# Patient Record
Sex: Female | Born: 1960
Health system: Southern US, Community
[De-identification: ages and names within clinical notes are randomized; demographics above are authoritative.]

## PROBLEM LIST (undated history)

## (undated) DIAGNOSIS — R002 Palpitations: Secondary | ICD-10-CM

## (undated) DIAGNOSIS — I10 Essential (primary) hypertension: Secondary | ICD-10-CM

## (undated) DIAGNOSIS — E0500 Thyrotoxicosis with diffuse goiter without thyrotoxic crisis or storm: Secondary | ICD-10-CM

## (undated) DIAGNOSIS — R0789 Other chest pain: Secondary | ICD-10-CM

## (undated) HISTORY — DX: Essential (primary) hypertension: I10

## (undated) HISTORY — DX: Other chest pain: R07.89

## (undated) HISTORY — PX: ABDOMINAL HYSTERECTOMY: SHX81

## (undated) HISTORY — DX: Palpitations: R00.2

---

## 1988-06-25 HISTORY — PX: TONSILLECTOMY: SHX5217

## 2001-02-12 ENCOUNTER — Encounter (INDEPENDENT_AMBULATORY_CARE_PROVIDER_SITE_OTHER): Payer: Self-pay | Admitting: Specialist

## 2001-02-12 ENCOUNTER — Other Ambulatory Visit: Admission: RE | Admit: 2001-02-12 | Discharge: 2001-02-12 | Payer: Self-pay | Admitting: Obstetrics and Gynecology

## 2001-09-04 ENCOUNTER — Encounter: Payer: Self-pay | Admitting: Emergency Medicine

## 2001-09-04 ENCOUNTER — Encounter: Admission: RE | Admit: 2001-09-04 | Discharge: 2001-09-04 | Payer: Self-pay | Admitting: Emergency Medicine

## 2002-10-26 ENCOUNTER — Other Ambulatory Visit: Admission: RE | Admit: 2002-10-26 | Discharge: 2002-10-26 | Payer: Self-pay | Admitting: Obstetrics and Gynecology

## 2004-01-27 ENCOUNTER — Encounter: Admission: RE | Admit: 2004-01-27 | Discharge: 2004-01-27 | Payer: Self-pay | Admitting: Emergency Medicine

## 2004-06-05 ENCOUNTER — Encounter: Admission: RE | Admit: 2004-06-05 | Discharge: 2004-06-05 | Payer: Self-pay | Admitting: Emergency Medicine

## 2004-08-08 ENCOUNTER — Ambulatory Visit: Payer: Self-pay | Admitting: Psychiatry

## 2004-08-08 ENCOUNTER — Inpatient Hospital Stay (HOSPITAL_COMMUNITY): Admission: RE | Admit: 2004-08-08 | Discharge: 2004-08-10 | Payer: Self-pay | Admitting: Psychiatry

## 2004-08-08 ENCOUNTER — Emergency Department (HOSPITAL_COMMUNITY): Admission: EM | Admit: 2004-08-08 | Discharge: 2004-08-08 | Payer: Self-pay | Admitting: Emergency Medicine

## 2004-08-16 ENCOUNTER — Other Ambulatory Visit: Admission: RE | Admit: 2004-08-16 | Discharge: 2004-08-16 | Payer: Self-pay | Admitting: Obstetrics and Gynecology

## 2004-09-07 ENCOUNTER — Encounter (INDEPENDENT_AMBULATORY_CARE_PROVIDER_SITE_OTHER): Payer: Self-pay | Admitting: Specialist

## 2004-09-07 ENCOUNTER — Ambulatory Visit (HOSPITAL_COMMUNITY): Admission: RE | Admit: 2004-09-07 | Discharge: 2004-09-07 | Payer: Self-pay | Admitting: Obstetrics and Gynecology

## 2005-10-03 ENCOUNTER — Other Ambulatory Visit: Admission: RE | Admit: 2005-10-03 | Discharge: 2005-10-03 | Payer: Self-pay | Admitting: Obstetrics and Gynecology

## 2006-02-21 ENCOUNTER — Ambulatory Visit (HOSPITAL_COMMUNITY): Admission: RE | Admit: 2006-02-21 | Discharge: 2006-02-22 | Payer: Self-pay | Admitting: Obstetrics and Gynecology

## 2006-02-21 ENCOUNTER — Encounter (INDEPENDENT_AMBULATORY_CARE_PROVIDER_SITE_OTHER): Payer: Self-pay | Admitting: Specialist

## 2009-06-29 ENCOUNTER — Inpatient Hospital Stay (HOSPITAL_COMMUNITY): Admission: AD | Admit: 2009-06-29 | Discharge: 2009-07-01 | Payer: Self-pay | Admitting: Internal Medicine

## 2009-06-29 ENCOUNTER — Ambulatory Visit: Payer: Self-pay | Admitting: Cardiology

## 2009-06-29 ENCOUNTER — Encounter: Payer: Self-pay | Admitting: Emergency Medicine

## 2009-06-29 ENCOUNTER — Ambulatory Visit: Payer: Self-pay | Admitting: Diagnostic Radiology

## 2009-06-30 ENCOUNTER — Encounter: Payer: Self-pay | Admitting: Cardiology

## 2009-07-04 ENCOUNTER — Telehealth: Payer: Self-pay | Admitting: Cardiology

## 2009-07-06 ENCOUNTER — Telehealth (INDEPENDENT_AMBULATORY_CARE_PROVIDER_SITE_OTHER): Payer: Self-pay | Admitting: *Deleted

## 2009-07-07 ENCOUNTER — Ambulatory Visit: Payer: Self-pay | Admitting: Cardiology

## 2009-07-07 ENCOUNTER — Ambulatory Visit: Payer: Self-pay

## 2009-07-07 ENCOUNTER — Encounter (HOSPITAL_COMMUNITY): Admission: RE | Admit: 2009-07-07 | Discharge: 2009-08-29 | Payer: Self-pay | Admitting: Cardiology

## 2009-07-08 ENCOUNTER — Encounter: Payer: Self-pay | Admitting: Cardiology

## 2009-07-12 ENCOUNTER — Ambulatory Visit: Payer: Self-pay | Admitting: Family Medicine

## 2009-07-12 DIAGNOSIS — I10 Essential (primary) hypertension: Secondary | ICD-10-CM | POA: Insufficient documentation

## 2009-07-12 DIAGNOSIS — I252 Old myocardial infarction: Secondary | ICD-10-CM

## 2009-07-12 DIAGNOSIS — E0590 Thyrotoxicosis, unspecified without thyrotoxic crisis or storm: Secondary | ICD-10-CM | POA: Insufficient documentation

## 2009-07-19 ENCOUNTER — Ambulatory Visit: Payer: Self-pay | Admitting: Family Medicine

## 2009-07-19 LAB — CONVERTED CEMR LAB
Cholesterol, target level: 200 mg/dL
LDL Goal: 100 mg/dL

## 2009-07-26 ENCOUNTER — Ambulatory Visit: Payer: Self-pay | Admitting: Family Medicine

## 2009-08-23 ENCOUNTER — Ambulatory Visit: Payer: Self-pay | Admitting: Cardiovascular Disease

## 2009-08-23 ENCOUNTER — Ambulatory Visit: Payer: Self-pay | Admitting: Family Medicine

## 2009-08-23 DIAGNOSIS — R079 Chest pain, unspecified: Secondary | ICD-10-CM

## 2009-08-23 DIAGNOSIS — R002 Palpitations: Secondary | ICD-10-CM

## 2009-08-31 ENCOUNTER — Encounter: Payer: Self-pay | Admitting: Cardiovascular Disease

## 2009-09-02 ENCOUNTER — Telehealth: Payer: Self-pay | Admitting: Cardiovascular Disease

## 2009-09-26 ENCOUNTER — Telehealth (INDEPENDENT_AMBULATORY_CARE_PROVIDER_SITE_OTHER): Payer: Self-pay | Admitting: *Deleted

## 2009-10-03 ENCOUNTER — Telehealth: Payer: Self-pay | Admitting: Cardiovascular Disease

## 2009-11-09 ENCOUNTER — Encounter: Payer: Self-pay | Admitting: Cardiovascular Disease

## 2009-12-15 ENCOUNTER — Telehealth: Payer: Self-pay | Admitting: Cardiovascular Disease

## 2010-01-10 ENCOUNTER — Encounter: Payer: Self-pay | Admitting: Cardiovascular Disease

## 2010-02-14 ENCOUNTER — Ambulatory Visit: Payer: Self-pay | Admitting: Cardiovascular Disease

## 2010-02-21 ENCOUNTER — Encounter: Payer: Self-pay | Admitting: Cardiology

## 2010-07-15 ENCOUNTER — Encounter: Payer: Self-pay | Admitting: Emergency Medicine

## 2010-07-27 NOTE — Assessment & Plan Note (Signed)
Summary: chest pain  add on   Visit Type:  np/6 chest pain Primary Provider:  Dr Levonne Hubert  CC:  chest pain/palps/ upset stomach.  History of Present Illness: 50 yo female with h/o HTN, Graves disease with admission to [facility] 1/11 with hyperthyroidism at which time she had tachycardia and a NSTEMI felt to be type II secondary to the hyperthyroidism which was a new diagnosis. She is seen by Dr. Sharl Ma for her thyroid issues.  Stress myoview 07/07/09 without ischemia. She was seen by Dr. Antoine Poche in the hospital. She has recently found out  that she is losing her job. She was seen by Dr. Tawanna Cooler this am and complained of chest pain and was added on to our schedule today.   She tells me that she has been under a tremendous amount of stress at work lately. She first noticed tightness in her arms and then a heaviness in her chest 4-5 days ago. No associated SOB. She felt hot and sweaty. The left sided chest pain lasted for 90 minutes. She did not take NTG. She went to work the next day and was exhausted without any energy. She had some recurrent chest tightness that night. Yesterday she had another episode of heaviness in her chest. She does feel her heart racing at times. There does not seem to be any irregularity.   Problems Prior to Update: 1)  Chest Pain  (ICD-786.50) 2)  Hyperthyroidism, Nos  (ICD-242.90) 3)  Essential Hypertension  (ICD-401.9) 4)  Myocardial Infarction, Hx of  (ICD-412)  Current Medications (verified): 1)  Aspir-Low 81 Mg Tbec (Aspirin) .... Once Daily 2)  Methimazole 10 Mg Tabs (Methimazole) .... Take 3 Tabs Once Daily 3)  Metoprolol Succinate 50 Mg Xr24h-Tab (Metoprolol Succinate) .... 3 in Am, 2 in Pm 4)  Nitro-Dur 0.4 Mg/hr Pt24 (Nitroglycerin) .Marland Kitchen.. 1 Tab As Needed 5)  Klor-Con M20 20 Meq Cr-Tabs (Potassium Chloride Crys Cr) .... Take One Tab Once Daily 6)  Furosemide 20 Mg Tabs (Furosemide) .... Take 1 Tablet By Mouth Every Morning 7)  Zolpidem Tartrate 10  Mg Tabs (Zolpidem Tartrate) .... Take One Tablet As Needed At Bedtime  Allergies: 1)  ! * Latex  Past History:  Past Medical History: Graves disease-hyperthyroidism migraines seasonal allergies ESSENTIAL HYPERTENSION  NSTEMI 1/11 (felt to be secondary to tachycardia/hyperthyroidism) Anxiety Insomnia    Past Surgical History: Reviewed history from 07/12/2009 and no changes required. Hysterectomy Tonsillectomy  Family History: Reviewed history from 07/12/2009 and no changes required. Father: deceased - TB/cancer Mother: deceased - breast cancer Siblings: 1 brother - deceased - throat cancer               3 sisters - healthy No family history of CAD  Social History: Reviewed history from 07/12/2009 and no changes required. Occupation:[employer] Single, no children.  Never Smoked Alcohol use-no Drug use-no  Review of Systems       The patient complains of fatigue, chest pain, palpitations, and anxiety.  The patient denies malaise, fever, weight gain/loss, vision loss, decreased hearing, hoarseness, shortness of breath, prolonged cough, wheezing, sleep apnea, coughing up blood, abdominal pain, blood in stool, nausea, vomiting, diarrhea, heartburn, incontinence, blood in urine, muscle weakness, joint pain, leg swelling, rash, skin lesions, headache, fainting, dizziness, depression, enlarged lymph nodes, easy bruising or bleeding, and environmental allergies.    Vital Signs:  Patient profile:   50 year old female Menstrual status:  hysterectomy Height:      65 inches Weight:  175 pounds Pulse rate:   58 / minute BP sitting:   126 / 77  (left arm) Cuff size:   large  Vitals Entered By: Oswald Hillock (August 23, 2009 11:10 AM)  Physical Exam  General:  General: Well developed, well nourished, NAD HEENT: OP clear, mucus membranes moist SKIN: warm, dry Neuro: No focal deficits Musculoskeletal: Muscle strength 5/5 all ext Psychiatric: Mood and affect  normal Neck: No JVD, no carotid bruits, +  thyromegaly, no lymphadenopathy. Lungs:Clear bilaterally, no wheezes, rhonci, crackles CV: RRR no murmurs, gallops rubs Abdomen: soft, NT, ND, BS present Extremities: No edema, pulses 2+.    Nuclear Study  Procedure date:  07/07/2009  Findings:      Stress Procedure   The patient received IV Lexiscan 0.4 mg over 15-seconds.  Myoview injected at 30-seconds.  There were no significant changes and abdominal pain with infusion.  Quantitative spect images were obtained after a 45 minute delay.  QPS  Raw Data Images:  Normal; no motion artifact; normal heart/lung ratio. Stress Images:  NI: Uniform and normal uptake of tracer in all myocardial segments. Rest Images:  Normal homogeneous uptake in all areas of the myocardium. Subtraction (SDS):  There is no evidence of scar or ischemia. Transient Ischemic Dilatation:  1.07  (Normal <1.22)  Lung/Heart Ratio:  .41  (Normal <0.45)  Quantitative Gated Spect Images  QGS EDV:  109 ml QGS ESV:  52 ml QGS EF:  53 % QGS cine images:  No discrete wall motion abnormalities noted.   Overall Impression   Exercise Capacity: Lexiscan study.  BP Response: Baseline hypertension.  Clinical Symptoms: Abdominal pain, no chest pain.  ECG Impression: No significant ST segment change suggestive of ischemia. Overall Impression: No evidence for ischemia or infarction.  Slightly decreased LV systolic function.       EKG  Procedure date:  08/23/2009  Findings:      Sinus bradycardia, rate of 59 bpm. No ischemic changes.   Echocardiogram  Procedure date:  06/30/2009  Findings:      - Left ventricle: The cavity size was normal. Wall thickness was     increased in a pattern of mild LVH. Systolic function was normal.     The estimated ejection fraction was in the range of 60% to 65%.   - Aortic valve: Mild regurgitation.   - Mitral valve: Moderate regurgitation.   - Pulmonary arteries: PA peak pressure:  56mm Hg (S).  Impression & Recommendations:  Problem # 1:  CHEST PAIN (ICD-786.50) Her chest pain has mostly atypical features, however, she was recently admitted to [facility] with chest pain and found to have mildly elevated cardiac enzymes. Follow up outpatient stress testing with no ischemia. She has had continued chest pain. This may be related to her hyperthyoidism and anxiety. Given the above with severe chest pain, I think it is reasonable to proceed with a diagnostic left and right heart catheterization to exclude CAD and also to assess her pulmonary artery pressures (elevated on echo). I have discussed performing a heart cath this week but she is unable to accomodate this. I have talked her into the week of March 16th. We will get labs that week and plan the cath on Wednesday March 16th. Risks and benefits were discussed with the pt.   Her updated medication list for this problem includes:    Aspir-low 81 Mg Tbec (Aspirin) ..... Once daily    Metoprolol Succinate 50 Mg Xr24h-tab (Metoprolol succinate) .Marland KitchenMarland KitchenMarland KitchenMarland Kitchen 3  in am, 2 in pm    Nitro-dur 0.4 Mg/hr Pt24 (Nitroglycerin) .Marland Kitchen... 1 tab as needed  Orders: Cardiac Catheterization (Cardiac Cath) Event (Event)  Problem # 2:  PALPITATIONS (ICD-785.1) We will have her wear a 21 day event monitor to exclude any malignant arrhythmias.   Her updated medication list for this problem includes:    Aspir-low 81 Mg Tbec (Aspirin) ..... Once daily    Metoprolol Succinate 50 Mg Xr24h-tab (Metoprolol succinate) .Marland KitchenMarland KitchenMarland KitchenMarland Kitchen 3 in am, 2 in pm    Nitro-dur 0.4 Mg/hr Pt24 (Nitroglycerin) .Marland Kitchen... 1 tab as needed  Patient Instructions: 1)  Your physician recommends that you schedule a follow-up appointment in: 4 weeks 2)  Your physician recommends that you return for lab work on August 31, 2009  --CBC with diff, BMP, PT, PTT  786.5 3)  Your physician recommends that you continue on your current medications as directed. Please refer to the Current Medication  list given to you today. 4)  Your physician has recommended that you wear an event monitor.  Event monitors are medical devices that record the heart's electrical activity. Doctors most often use these monitors to diagnose arrhythmias. Arrhythmias are problems with the speed or rhythm of the heartbeat. The monitor is a small, portable device. You can wear one while you do your normal daily activities. This is usually used to diagnose what is causing palpitations/syncope (passing out). 5)  Your physician has requested that you have a cardiac catheterization.  Cardiac catheterization is used to diagnose and/or treat various heart conditions. Doctors may recommend this procedure for a number of different reasons. The most common reason is to evaluate chest pain. Chest pain can be a symptom of coronary artery disease (CAD), and cardiac catheterization can show whether plaque is narrowing or blocking your heart's arteries. This procedure is also used to evaluate the valves, as well as measure the blood flow and oxygen levels in different parts of your heart.  For further information please visit [email or web address]  Please follow instruction sheet, as given.  Appended Document: chest pain  add on Pt. requesting that we refill Zolpidem. Per Dr. Clifton James we can refill one time for 30 tabs. Message left for pt to call back to determine which pharmacy to send refill.  Appended Document: chest pain  add on Spoke with pt. Zolpidem 10 mg (30 tabs, no refills) called to [facility] on [address]. Pt aware there are no refills on this.  Appended Document: chest pain  add on Event monitor reviewed. NSR. Some bradycardia with rates as low as 48 in the am. No SVT or VT. I recommended a cath at last visit but she cancelled. We need to call her and reschedule this. I can do it whenever she would like or I could see her back in clinic first. cdm

## 2010-07-27 NOTE — Progress Notes (Signed)
  Phone Note Other Incoming   Request: Send information Summary of Call: Request received from [employer] forwarded to [employer].

## 2010-07-27 NOTE — Progress Notes (Signed)
Summary: high b/p  Phone Note From Other Clinic Call back at [facility] Phone [phone]   Caller: [place] [facility] [phone] Request: Talk with Nurse Details for Reason: pt at clinic today with high pressure 172/102 sitting.  Initial call taken by: Lorne Skeens,  July 04, 2009 4:20 PM  Follow-up for Phone Call        pt returned to work today she went to her office clinic for a BP check and it was elevated she doesn't c/o feeling bad, office is concerned about her returning to work w/elevated BP, pt is sch for myoview on 1 /13 and f/u w/Dr Cruzito Standre on 1/31, she is on met. 50mg  daily, will discuss w/Dr Tenny Craw and call back Meredith Staggers, RN  July 04, 2009 4:31 PM   Additional Follow-up for Phone Call Additional follow up Details #1::        Discussed w/Dr Tenny Craw have pt increase metoprolol to 75mg  daily, pt is aware, she states she feels a little lightheaded and doesn't feel that great, Meriam Sprague feels pt shouldn't work until BP is better undercontrol, pt is sch for [employer], pt can stay out of work until then, she also states she was not able to get her potassium filled b/c it was too expensive, will call in pot 2 tabs daily at it will be $8 at [facility], pt will start it asap Meredith Staggers, RN  July 04, 2009 5:04 PM     New/Updated Medications: POTASSIUM CHLORIDE CR 10 MEQ CR-CAPS (POTASSIUM CHLORIDE) Take 2 tablet by mouth daily Prescriptions: POTASSIUM CHLORIDE CR 10 MEQ CR-CAPS (POTASSIUM CHLORIDE) Take 2 tablet by mouth daily  #60 x 6   Entered by:   Meredith Staggers, RN   Authorized by:   Rollene Rotunda, MD, [facility]   Signed by:   Meredith Staggers, RN on 07/04/2009   Method used:   Electronically to        [facility]  [phone]* (retail)       [address]       [place], [place]  [address]       Ph: [phone] or [phone]       Fax: [phone]   RxID:   [id number]

## 2010-07-27 NOTE — Procedures (Signed)
Summary: End of Service Summary Page  End of Service Summary Page   Imported By: Debby Freiberg 10/03/2009 16:44:20  _____________________________________________________________________  External Attachment:    Type:   Image     Comment:   External Document  Appended Document: End of Service Summary Page pt. notified of results.

## 2010-07-27 NOTE — Letter (Signed)
Summary: [facility]   Imported By: Kassie Mends 09/12/2009 09:35:21  _____________________________________________________________________  External Attachment:    Type:   Image     Comment:   External Document

## 2010-07-27 NOTE — Progress Notes (Signed)
Summary: Nuclear Pre-Procedure  Phone Note Outgoing Call   Call placed by: Milana Na, EMT-P,  July 06, 2009 2:51 PM Summary of Call: Left message with information on Myoview Information Sheet (see scanned document for details).      Nuclear Med Background Indications for Stress Test: Evaluation for Ischemia, Post Hospital  Indications Comments: 06/29/09 - 07/01/09 New DX Hyperthyroidism CP/HTN NSTEMI >Troponins  History: Echo, Myocardial Infarction  History Comments: 06/30/09 ECHO EF 60-65% mod. MR MI Type 2 NSTEMI     Nuclear Pre-Procedure Cardiac Risk Factors: Hypertension  Nuclear Med Study Referring MD:  J.Hochrein

## 2010-07-27 NOTE — Cardiovascular Report (Signed)
Summary: Pre Cath Orders  Pre Cath Orders   Imported By: Roderic Ovens 09/05/2009 11:32:16  _____________________________________________________________________  External Attachment:    Type:   Image     Comment:   External Document

## 2010-07-27 NOTE — Assessment & Plan Note (Signed)
Summary: 1 wk rov/mm   Vital Signs:  Patient profile:   50 year old female Menstrual status:  hysterectomy BP sitting:   140 / 90  (left arm)  Vitals Entered By: Kern Reap CMA Duncan Dull) (July 26, 2009 10:16 AM)  Reason for Visit follow up HTN   History of Present Illness: [name] is a 50 year old female, who comes in today for evaluation of hypertension.     We saw her a week ago, with marked elevation of her blood pressure with a systolic in the 180 range.  We increased her beta blocker to 100 mg b.i.d. added 20 mg of Lasix daily.  BP today by me 160/84 left arm sitting position.  Pulse is 70 and regular.  No side effects from new medication regime  Allergies: 1)  ! * Latex  Past History:  Past medical, surgical, family and social histories (including risk factors) reviewed for relevance to current acute and chronic problems.  Past Medical History: Reviewed history from 07/12/2009 and no changes required. Myocardial infarction, hx of graves disease migraines seasonal allergies  Past Surgical History: Reviewed history from 07/12/2009 and no changes required. Hysterectomy Tonsillectomy  Family History: Reviewed history from 07/12/2009 and no changes required. Father: deceased - TB/cancer Mother: deceased - breast cancer Siblings: 1 brother - deceased - throat cancer               3 sisters - healthy  Social History: Reviewed history from 07/12/2009 and no changes required. Occupation:[employer] Single Never Smoked Alcohol use-no Drug use-no  Review of Systems      See HPI  Physical Exam  General:  Well-developed,well-nourished,in no acute distress; alert,appropriate and cooperative throughout examination Heart:  160/84, left arm sitting position   Impression & Recommendations:  Problem # 1:  ESSENTIAL HYPERTENSION (ICD-401.9) Assessment Improved  Her updated medication list for this problem includes:    Metoprolol Succinate 50 Mg Xr24h-tab  (Metoprolol succinate) .Marland Kitchen... 2 by mouth two times a day    Furosemide 20 Mg Tabs (Furosemide) .Marland Kitchen... Take 1 tablet by mouth every morning  Complete Medication List: 1)  Aspir-low 81 Mg Tbec (Aspirin) .... Once daily 2)  Methimazole 10 Mg Tabs (Methimazole) .... Take 3 tabs once daily 3)  Metoprolol Succinate 50 Mg Xr24h-tab (Metoprolol succinate) .... 2 by mouth two times a day 4)  Nitro-dur 0.4 Mg/hr Pt24 (Nitroglycerin) .Marland Kitchen.. 1 tab as needed 5)  Klor-con M20 20 Meq Cr-tabs (Potassium chloride crys cr) .... Take one tab once daily 6)  Zolpidem Tartrate 10 Mg Tabs (Zolpidem tartrate) .... Take one tab at bedtime 7)  Furosemide 20 Mg Tabs (Furosemide) .... Take 1 tablet by mouth every morning  Patient Instructions: 1)  increase the metaprolol to 3 tablets in the morning and two tablets at bedtime continued the Lasix one daily. 2)  Check your blood pressure every morning.  Return in 4 weeks for follow-up.........Marland Kitchen remember to bring all the data and the device

## 2010-07-27 NOTE — Progress Notes (Signed)
Summary: appt  Phone Note Call from Patient   Caller: Patient Summary of Call: Pt. calling regarding letter she received. Letter was regarding holter results.  Pt. given results of holter. Pt would like to see Dr. Clifton James before end of summer but does not want to reschedule cath at this time. Pt given appt with Dr. Clifton James on February 14, 2010. Initial call taken by: Dossie Arbour, RN, BSN,  December 15, 2009 10:15 AM

## 2010-07-27 NOTE — Letter (Signed)
Summary: [facility] Visit Note   [facility] Office Visit Note   Imported By: Roderic Ovens 02/15/2010 11:59:11  _____________________________________________________________________  External Attachment:    Type:   Image     Comment:   External Document

## 2010-07-27 NOTE — Assessment & Plan Note (Signed)
Summary: Cardiology Nuclear Study  Nuclear Med Background Indications for Stress Test: Evaluation for Ischemia, Post Hospital  Indications Comments: 06/29/09 - 07/01/09 New DX Hyperthyroidism CP/HTN NSTEMI >Troponins  History: Echo, Myocardial Infarction  History Comments: 06/30/09 ECHO EF 60-65% mod. MR MI Type 2 NSTEMI     Nuclear Pre-Procedure Cardiac Risk Factors: Hypertension Caffeine/Decaff Intake: None NPO After: 7:30 PM Lungs: clear IV 0.9% NS with Angio Cath: 20g     IV Site: (R) AC IV Started by: Stanton Kidney EMT-P Chest Size (in) 38     Cup Size B     Height (in): 65 Weight (lb): 166 BMI: 27.72 Tech Comments: Metoprolol held > 24 hours, per Pt.  Patient switched to Lexiscan due to HTN.  Nuclear Med Study 1 or 2 day study:  1 day     Stress Test Type:  Eugenie Birks Reading MD:  Marca Ancona, MD     Referring MD:  J.Hochrein Resting Radionuclide:  Technetium 6m Tetrofosmin     Resting Radionuclide Dose:  11.0 mCi  Stress Radionuclide:  Technetium 47m Tetrofosmin     Stress Radionuclide Dose:  33.0 mCi   Stress Protocol   Lexiscan: 0.4 mg   Stress Test Technologist:  Milana Na EMT-P     Nuclear Technologist:  Burna Mortimer Deal RT-N  Rest Procedure  Myocardial perfusion imaging was performed at rest 45 minutes following the intravenous administration of Myoview Technetium 80m Tetrofosmin.  Stress Procedure  The patient received IV Lexiscan 0.4 mg over 15-seconds.  Myoview injected at 30-seconds.  There were no significant changes and abdominal pain with infusion.  Quantitative spect images were obtained after a 45 minute delay.  QPS Raw Data Images:  Normal; no motion artifact; normal heart/lung ratio. Stress Images:  NI: Uniform and normal uptake of tracer in all myocardial segments. Rest Images:  Normal homogeneous uptake in all areas of the myocardium. Subtraction (SDS):  There is no evidence of scar or ischemia. Transient Ischemic Dilatation:  1.07  (Normal  <1.22)  Lung/Heart Ratio:  .41  (Normal <0.45)  Quantitative Gated Spect Images QGS EDV:  109 ml QGS ESV:  52 ml QGS EF:  53 % QGS cine images:  No discrete wall motion abnormalities noted.    Overall Impression  Exercise Capacity: Lexiscan study.  BP Response: Baseline hypertension.  Clinical Symptoms: Abdominal pain, no chest pain.  ECG Impression: No significant ST segment change suggestive of ischemia. Overall Impression: No evidence for ischemia or infarction.  Slightly decreased LV systolic function.   Appended Document: Cardiology Nuclear Study No ischemia or infarct.  Appended Document: Nuclear Study  Called patient and left message on machine for pt to call back for results        Appended Document: Cardiology Nuclear Study pt aware of results and aware of appt on 07/25/2009 with Dr Antoine Poche

## 2010-07-27 NOTE — Letter (Signed)
Summary: Results Follow-up  [facility], Main Office  [address]   [place], [place] [address]   Phone: [phone]  Fax: [phone]     Nov 09, 2009 MRN: [id number]   [name] [address] [place], [place]  [address]   Dear [name],  We have received the results from your recent tests and have been unable to contact you.  Please call our office at the number listed above so that Dr. Clifton James                             or his nurse may review the results with you.    Thank you, Angie Rouge, RN [facility]

## 2010-07-27 NOTE — Progress Notes (Signed)
  Phone Note Call from Patient Call back at Home Phone [phone]   Action Taken: Appt Scheduled Summary of Call: 09/02/09 Call patient re:her labs this am,and she told to cancel them along with her cath. I ask her why, and she said because she was wearing  30 day monitor. D.Miller Initial call taken by: Tera Mater, CNA,  September 02, 2009 9:51 AM  Follow-up for Phone Call        Called pt to discuss cath. Left message to call back. Dossie Arbour, RN, BSN  September 02, 2009 10:12 AM left message to call back Dossie Arbour, RN, BSN  September 05, 2009 9:37 AM Pt. has not returned called. Will cancel cath as per message above. Dr. Clifton James aware. [facility] lab notified Follow-up by: Dossie Arbour, RN, BSN,  September 05, 2009 5:35 PM

## 2010-07-27 NOTE — Assessment & Plan Note (Signed)
Summary: new to est-fup heart attack-ok per dr Jennavie Martinek//ccm   Vital Signs:  Patient profile:   50 year old female Menstrual status:  hysterectomy Height:      65 inches Weight:      168 pounds Temp:     98.8 degrees F oral BP sitting:   162 / 110  (left arm) Cuff size:   regular  Vitals Entered By: Kern Reap CMA Duncan Dull) (July 12, 2009 4:02 PM)  Reason for Visit new to establish - follow up hospital  History of Present Illness: [name] is a 50 year old single female, who comes in today as a new patient.  She was recently hospitalized from January the fifth to January the seventh.  She was admitted with chest pain, found to have Graves' disease.  She was also told that she had enzyme elevation, and a slight heart attack.  Her cardiologist is Dr. Leta Jungling, and her endocrinologist is Dr. Sharl Ma.  Her previous medical doctor was Dr. Lorenz Coaster.  She subsequently went to another physician that she did not like therefore, she is switching to [place].  Her current medications are 81 mg baby aspirin daily, Tapazole 10 mg t.i.d. Metaprel 50 mg q.a.m., potassium 20 mEq daily.  She was also given nitroglycerin sublingual to take p.r.n.  She states her employer [employer] will not let her go back to work until her blood pressure is normal.  I explained to her that a big part of normalizing her blood pressure is to resolve.  The Graves' disease.  Therefore, will give her and temporary note, however, long-term will defer to endocrinology.  Last physical exam blood Spring 2009.  Preventive Screening-Counseling & Management  Alcohol-Tobacco     Smoking Status: never      Drug Use:  no.    Allergies: 1)  ! * Latex  Past History:  Past medical, surgical, family and social histories (including risk factors) reviewed, and no changes noted (except as noted below).  Past Medical History: Myocardial infarction, hx of graves disease migraines seasonal allergies  Past Surgical  History: Hysterectomy Tonsillectomy  Family History: Reviewed history and no changes required. Father: deceased - TB/cancer Mother: deceased - breast cancer Siblings: 1 brother - deceased - throat cancer               3 sisters - healthy  Social History: Reviewed history and no changes required. Occupation:[employer] Single Never Smoked Alcohol use-no Drug use-no Smoking Status:  never Drug Use:  no  Review of Systems      See HPI  Physical Exam  General:  Well-developed,well-nourished,in no acute distress; alert,appropriate and cooperative throughout examination Heart:  180 over hundred   Problems:  Medical Problems Added: 1)  Dx of Hyperthyroidism, Nos  (ICD-242.90) 2)  Dx of Essential Hypertension  (ICD-401.9) 3)  Dx of Myocardial Infarction, Hx of  (ICD-412)  Impression & Recommendations:  Problem # 1:  ESSENTIAL HYPERTENSION (ICD-401.9) Assessment New  Her updated medication list for this problem includes:    Metoprolol Succinate 50 Mg Xr24h-tab (Metoprolol succinate) .Marland Kitchen... Take 1 tablet by mouth two times a day  Orders: Prescription Created Electronically [phone])  Problem # 2:  HYPERTHYROIDISM, NOS (ICD-242.90) Assessment: New  Her updated medication list for this problem includes:    Methimazole 10 Mg Tabs (Methimazole) .Marland Kitchen... Take 3 tabs once daily    Metoprolol Succinate 50 Mg Xr24h-tab (Metoprolol succinate) .Marland Kitchen... Take 1 tablet by mouth two times a day  Orders: Prescription Created Electronically [phone])  Complete Medication List: 1)  Aspir-low 81 Mg Tbec (Aspirin) .... Once daily 2)  Methimazole 10 Mg Tabs (Methimazole) .... Take 3 tabs once daily 3)  Metoprolol Succinate 50 Mg Xr24h-tab (Metoprolol succinate) .... Take 1 tablet by mouth two times a day 4)  Nitro-dur 0.4 Mg/hr Pt24 (Nitroglycerin) .Marland Kitchen.. 1 tab as needed 5)  Klor-con M20 20 Meq Cr-tabs (Potassium chloride crys cr) .... Take one tab once daily 6)  Zolpidem Tartrate 10 Mg  Tabs (Zolpidem tartrate) .... Take one tab at bedtime  Patient Instructions: 1)  increase the metacarpal off to 50 mg b.i.d..  Check your blood pressure morning, noon, and night.  Return in one week for follow-up Prescriptions: METOPROLOL SUCCINATE 50 MG XR24H-TAB (METOPROLOL SUCCINATE) Take 1 tablet by mouth two times a day  #200 x 3   Entered and Authorized by:   Roderick Pee MD   Signed by:   Roderick Pee MD on 07/12/2009   Method used:   Electronically to        [facility]  [phone]* (retail)       [address]       [place], [place]  [address]       Ph: [phone] or [phone]       Fax: [phone]   RxID:   [id number]

## 2010-07-27 NOTE — Letter (Signed)
Summary: Cardiac Catheterization Instructions- [facility]  [facility], Main Office  [address]   [place], [place] [address]   Phone: [phone]  Fax: [phone]     08/23/2009 MRN: [id number]  [name] [address] [place], [place]  [address]  Dear Ms. Fulbright,   You are scheduled for a Cardiac Catheterization on March 16, 2011_ with Dr.McAlhany___ Please arrive to the 1st floor of the Heart and Vascular Center at [facility] at 7:30__ am  on the day of your procedure. Please do not arrive before 6:30 a.m. Call the Heart and Vascular Center at [phone] if you are unable to make your appointmnet. The Code to get into the parking garage under the building is_9000_______. Take the elevators to the 1st floor. You must have someone to drive you home. Someone must be with you for the first 24 hours after you arrive home. Please wear clothes that are easy to get on and off and wear slip-on shoes. Do not eat or drink after midnight except water with your medications that morning. Bring all your medications and current insurance cards with you.  ___ DO NOT take these medications before your procedure: ________________________________________________________________  ___ Make sure you take your aspirin.  __x_ You may take ALL of your medications with water that morning. ________________________________________________________________________________________________________________________________  ___ DO NOT take ANY medications before your procedure.  ___ Pre-med instructions:  ________________________________________________________________________________________________________________________________  The usual length of stay after your procedure is 2 to 3 hours. This can vary.  If you have any questions, please call the office at the number listed above.  Please come to office on August 31, 2009 for blood work. The lab opens at 8:30  and is closed from 1:30 - 2:30. Lab closes for the day at 4:30  Dossie Arbour, RN, BSN

## 2010-07-27 NOTE — Letter (Signed)
Summary: [facility] Visit Note   [facility] Office Visit Note   Imported By: Roderic Ovens 03/31/2010 14:02:51  _____________________________________________________________________  External Attachment:    Type:   Image     Comment:   External Document

## 2010-07-27 NOTE — Progress Notes (Signed)
Summary: Event monitor  Phone Note Outgoing Call   Summary of Call: Called pt regarding results of event monitor and possible rescheduling of cardiac cath (see append of 4/11 to office note). Left message to call back. Initial call taken by: Dossie Arbour, RN, BSN,  October 03, 2009 3:45 PM  Follow-up for Phone Call        left message to call back Dossie Arbour, RN, BSN  October 11, 2009 1:18 PM      Appended Document: Event monitor left message to call back.  Appended Document: Event monitor left message to call back  Appended Document: Event monitor Letter sent

## 2010-07-27 NOTE — Assessment & Plan Note (Signed)
Summary: 1 MONTH [name]/NJR   Vital Signs:  Patient profile:   50 year old female Menstrual status:  hysterectomy Weight:      175 pounds BMI:     29.23 Temp:     98.2 degrees F oral BP sitting:   114 / 80  (left arm) Cuff size:   regular  Vitals Entered By: Raechel Ache, RN (August 23, 2009 8:59 AM) CC: 1 mo [name].   CC:  1 mo [name].Marland Kitchen  History of Present Illness: [name] is a 50 year old female, nonsmoker, who works at [employer]........... and is due to lose her job and because the company is relocating............ however she is staying here in [place]....... who comes in today for blood pressure evaluation.  We increased her beta blocker to 150 mg in the morning and 100 mg in the evening.  BP now 120 to 130 systolic, diastolic 80.  She states last Thursday, when she got home, and evening, after a stressful day.  She had some discomfort in her chest.  She describes it as a heavy feeling and points to the left upper anterior chest wall as a source of her discomfort.  She states that it became sharp was an 8 on a scale of one to 10.  She states it radiated down both arms.  She developed some nausea no diaphoresis or shortness of breath.  The episode lasted 60 minutes.  She did not take a nitroglycerin.  Since, then she's had intermittent episodes like this although there are not as severe.  Allergies: 1)  ! * Latex  Past History:  Past medical, surgical, family and social histories (including risk factors) reviewed, and no changes noted (except as noted below).  Past Medical History: Reviewed history from 07/12/2009 and no changes required. Myocardial infarction, hx of graves disease migraines seasonal allergies  Past Surgical History: Reviewed history from 07/12/2009 and no changes required. Hysterectomy Tonsillectomy  Family History: Reviewed history from 07/12/2009 and no changes required. Father: deceased - TB/cancer Mother: deceased - breast cancer Siblings:  1 brother - deceased - throat cancer               3 sisters - healthy  Social History: Reviewed history from 07/12/2009 and no changes required. Occupation:[employer] Single Never Smoked Alcohol use-no Drug use-no  Review of Systems      See HPI  Physical Exam  General:  Well-developed,well-nourished,in no acute distress; alert,appropriate and cooperative throughout examination Lungs:  Normal respiratory effort, chest expands symmetrically. Lungs are clear to auscultation, no crackles or wheezes. Heart:  Normal rate and regular rhythm. S1 and S2 normal without gallop, murmur, click, rub or other extra sounds.........BP 130/80 by me right arm sitting position   Impression & Recommendations:  Problem # 1:  ESSENTIAL HYPERTENSION (ICD-401.9) Assessment Improved  Her updated medication list for this problem includes:    Metoprolol Succinate 50 Mg Xr24h-tab (Metoprolol succinate) .Marland KitchenMarland KitchenMarland KitchenMarland Kitchen 3 in am, 2 in pm    Furosemide 20 Mg Tabs (Furosemide) .Marland Kitchen... Take 1 tablet by mouth every morning  Problem # 2:  CHEST PAIN (ICD-786.50) Assessment: New  Orders: EKG w/ Interpretation (93000)  Complete Medication List: 1)  Aspir-low 81 Mg Tbec (Aspirin) .... Once daily 2)  Methimazole 10 Mg Tabs (Methimazole) .... Take 3 tabs once daily 3)  Metoprolol Succinate 50 Mg Xr24h-tab (Metoprolol succinate) .... 3 in am, 2 in pm 4)  Nitro-dur 0.4 Mg/hr Pt24 (Nitroglycerin) .Marland Kitchen.. 1 tab as needed 5)  Klor-con M20 20  Meq Cr-tabs (Potassium chloride crys cr) .... Take one tab once daily 6)  Zolpidem Tartrate 10 Mg Tabs (Zolpidem tartrate) .... Take one tab at bedtime 7)  Furosemide 20 Mg Tabs (Furosemide) .... Take 1 tablet by mouth every morning  Patient Instructions: 1)  continue your current medications.  I will set Appointment to see her cardiologist ASAP.............go immediately to the cardiology office

## 2010-07-27 NOTE — Assessment & Plan Note (Signed)
Summary: 1 wk rov/njr   Vital Signs:  Patient profile:   50 year old female Menstrual status:  hysterectomy Weight:      169 pounds Temp:     98.4 degrees F oral BP sitting:   180 / 96  (left arm) Cuff size:   regular  Vitals Entered By: Kern Reap CMA Duncan Dull) (July 19, 2009 12:03 PM)  Reason for Visit follow up bp  History of Present Illness: [name] is a 50 year old female, who comes back today for reevaluation of hypertension.  We had her on. atenolol 50 mg b.i.d. however, her BP is 180/96.  Pulse 80 and regular.  Lipid Management History:      Positive NCEP/ATP III risk factors include hypertension and ASHD (either angina/prior MI/prior CABG).  Negative NCEP/ATP III risk factors include female age less than 74 years old and non-tobacco-user status.    Allergies: 1)  ! * Latex  Past History:  Past medical, surgical, family and social histories (including risk factors) reviewed for relevance to current acute and chronic problems.  Past Medical History: Reviewed history from 07/12/2009 and no changes required. Myocardial infarction, hx of graves disease migraines seasonal allergies  Past Surgical History: Reviewed history from 07/12/2009 and no changes required. Hysterectomy Tonsillectomy  Family History: Reviewed history from 07/12/2009 and no changes required. Father: deceased - TB/cancer Mother: deceased - breast cancer Siblings: 1 brother - deceased - throat cancer               3 sisters - healthy  Social History: Reviewed history from 07/12/2009 and no changes required. Occupation:[employer] Single Never Smoked Alcohol use-no Drug use-no  Review of Systems      See HPI  Physical Exam  General:  Well-developed,well-nourished,in no acute distress; alert,appropriate and cooperative throughout examination Heart:  180/96 right arm sitting position   Impression & Recommendations:  Problem # 1:  ESSENTIAL HYPERTENSION  (ICD-401.9) Assessment Unchanged  Her updated medication list for this problem includes:    Metoprolol Succinate 50 Mg Xr24h-tab (Metoprolol succinate) .Marland Kitchen... 2 by mouth two times a day    Furosemide 20 Mg Tabs (Furosemide) .Marland Kitchen... Take 1 tablet by mouth every morning  Complete Medication List: 1)  Aspir-low 81 Mg Tbec (Aspirin) .... Once daily 2)  Methimazole 10 Mg Tabs (Methimazole) .... Take 3 tabs once daily 3)  Metoprolol Succinate 50 Mg Xr24h-tab (Metoprolol succinate) .... 2 by mouth two times a day 4)  Nitro-dur 0.4 Mg/hr Pt24 (Nitroglycerin) .Marland Kitchen.. 1 tab as needed 5)  Klor-con M20 20 Meq Cr-tabs (Potassium chloride crys cr) .... Take one tab once daily 6)  Zolpidem Tartrate 10 Mg Tabs (Zolpidem tartrate) .... Take one tab at bedtime 7)  Furosemide 20 Mg Tabs (Furosemide) .... Take 1 tablet by mouth every morning  Other Orders: Prescription Created Electronically [phone])  Lipid Assessment/Plan:      Based on NCEP/ATP III, the patient's risk factor category is "history of coronary disease, peripheral vascular disease, cerebrovascular disease, or aortic aneurysm".  The patient's lipid goals are as follows: Total cholesterol goal is 200; LDL cholesterol goal is 100; HDL cholesterol goal is 40; Triglyceride goal is 150.     Patient Instructions: 1)  take a 50-mg of the beta-blocker when you get home now, then two tablets at bedtime tonight, then, starting tomorrow two tabs twice a day.  Also add  Lasix, 20 milligrams now then 20 mg every morning.  Check your blood pressure 3 times a day.  Return in one  week for follow-up with the data and the device too Prescriptions: METOPROLOL SUCCINATE 50 MG XR24H-TAB (METOPROLOL SUCCINATE) 2 by mouth two times a day  #400 x 3   Entered and Authorized by:   Roderick Pee MD   Signed by:   Roderick Pee MD on 07/19/2009   Method used:   Electronically to        [facility]  [phone]* (retail)       [address]       [place], [place]  [address]       Ph: [phone] or [phone]       Fax: [phone]   RxID:   [id number] FUROSEMIDE 20 MG TABS (FUROSEMIDE) Take 1 tablet by mouth every morning  #100 x 3   Entered and Authorized by:   Roderick Pee MD   Signed by:   Roderick Pee MD on 07/19/2009   Method used:   Electronically to        [facility]  [phone]* (retail)       [address]       [place], [place]  [address]       Ph: [phone] or [phone]       Fax: [phone]   RxID:   [id number]

## 2010-07-27 NOTE — Assessment & Plan Note (Signed)
Summary: [name] call/lg   Visit Type:  Angie Primary Provider:  Dr Levonne Hubert  CC:  edema/hands/carpal tunnel....denies cp or sob..Valenzuela weight is up 13 lb since 08/2009.  History of Present Illness: 50 yo female with h/o HTN, Graves disease with admission to [facility] 1/11 with hyperthyroidism at which time [name] had tachycardia and a NSTEMI felt to be type II secondary to the hyperthyroidism which was a new diagnosis. [name] is seen by Dr. Sharl Ma for [name] thyroid issues.  Stress myoview 07/07/09 without ischemia. I saw [name] as an office add on in March 2011. At that time, [name] was having daily left sided chest pain with diaphoresis and fatigue. [name] also complained of palpitations. I had [name] wear an event monitor which showed no arrythmias. Given [name] chest pain and the fact that [name] did have a NSTEMI while in the hospital in January 2011, I arranged a left heart cath to exclude CAD and a right heart cath  to assess her PA pressures (elevated on echo). [name] cancelled [name] cath and is here today for follow up. [name] tells me that [name] is feeling better. [name] denies any chest pain, SOB, palpitations, near syncope or syncope. [name] is no longer employed but did receive a good package from [employer] as they were closing so [name] is living off of this. [name] blood pressure at home has been in the 120-130/70-80.     Current Medications (verified): 1)  Aspir-Low 81 Mg Tbec (Aspirin) .... Once Daily 2)  Methimazole 10 Mg Tabs (Methimazole) .... 1/2 Tab Once Daily 3)  Metoprolol Succinate 50 Mg Xr24h-Tab (Metoprolol Succinate) .Marland Kitchen.. 1 Tab Once Daily 4)  Nitrostat 0.4 Mg Subl (Nitroglycerin) .Marland Kitchen.. 1 Tablet Under Tongue At Onset of Chest Pain; You May Repeat Every 5 Minutes For Up To 3 Doses. 5)  Zolpidem Tartrate 10 Mg Tabs (Zolpidem Tartrate) .... Take One Tablet As Needed At Bedtime  Allergies: 1)  ! * Latex  Past History:  Past Medical History: Reviewed history from 08/23/2009 and no changes  required. Graves disease-hyperthyroidism migraines seasonal allergies ESSENTIAL HYPERTENSION  NSTEMI 1/11 (felt to be secondary to tachycardia/hyperthyroidism) Anxiety Insomnia    Social History: Reviewed history from 08/23/2009 and no changes required. Unemployed, worked at [employer], no children.  Never Smoked Alcohol use-no Drug use-no  Review of Systems  The patient denies fatigue, malaise, fever, weight gain/loss, vision loss, decreased hearing, hoarseness, chest pain, palpitations, shortness of breath, prolonged cough, wheezing, sleep apnea, coughing up blood, abdominal pain, blood in stool, nausea, vomiting, diarrhea, heartburn, incontinence, blood in urine, muscle weakness, joint pain, leg swelling, rash, skin lesions, headache, fainting, dizziness, depression, anxiety, enlarged lymph nodes, easy bruising or bleeding, and environmental allergies.    Vital Signs:  Patient profile:   50 year old female Menstrual status:  hysterectomy Height:      65 inches Weight:      188 pounds BMI:     31.40 Pulse rate:   76 / minute Pulse rhythm:   regular BP sitting:   158 / 90  (left arm) Cuff size:   large  Vitals Entered By: Danielle Rankin, CMA (February 14, 2010 9:24 AM)  Physical Exam  General:  General: Well developed, well nourished, NAD Musculoskeletal: Muscle strength 5/5 all ext Psychiatric: Mood and affect normal Neck: No JVD, no carotid bruits, no lymphadenopathy. Lungs:Clear bilaterally, no wheezes, rhonci, crackles CV: RRR no murmurs, gallops rubs Abdomen: soft, NT, ND, BS present Extremities: No edema,  pulses 2+.    EKG  Procedure date:  02/14/2010  Findings:      NSR, normal EKG. Rate 76 bpm.   Impression & Recommendations:  Problem # 1:  MYOCARDIAL INFARCTION, HX OF (ICD-412) [name] had elevated cardiac enzymes this spring but had a normal stress test and normal LV function on echo. [name] refused cath in March. [name] symptoms of chest pain have  completely resolved. [name] EKG today is normal. We have discussed the possibility of coronary artery disease. [name] risk factors include HTN. I will not reschedule [name] cath as [name] is completely asymptomatic. [name] NSTEMI may have been secondary to [name] Graves disease and tachycardia during admission.   [name] updated medication list for this problem includes:    Aspir-low 81 Mg Tbec (Aspirin) ..... Once daily    Metoprolol Succinate 50 Mg Xr24h-tab (Metoprolol succinate) .Marland Kitchen... 1 tab once daily    Nitrostat 0.4 Mg Subl (Nitroglycerin) .Marland Kitchen... 1 tablet under tongue at onset of chest pain; you may repeat every 5 minutes for up to 3 doses.  Problem # 2:  ESSENTIAL HYPERTENSION (ICD-401.9) Elevated today. [name] tells me that it is well controlled at home. I will not make any changes.  [name] will follow up with Dr. Tawanna Cooler.   The following medications were removed from the medication list:    Furosemide 20 Mg Tabs (Furosemide) .Marland Kitchen... Take 1 tablet by mouth every morning [name] updated medication list for this problem includes:    Aspir-low 81 Mg Tbec (Aspirin) ..... Once daily    Metoprolol Succinate 50 Mg Xr24h-tab (Metoprolol succinate) .Marland Kitchen... 1 tab once daily  Patient Instructions: 1)  Your physician recommends that you schedule a follow-up appointment as needed

## 2010-08-09 ENCOUNTER — Telehealth (INDEPENDENT_AMBULATORY_CARE_PROVIDER_SITE_OTHER): Payer: Self-pay | Admitting: *Deleted

## 2010-08-29 ENCOUNTER — Telehealth (INDEPENDENT_AMBULATORY_CARE_PROVIDER_SITE_OTHER): Payer: Self-pay | Admitting: *Deleted

## 2010-08-31 ENCOUNTER — Encounter: Payer: Self-pay | Admitting: Cardiovascular Disease

## 2010-08-31 ENCOUNTER — Telehealth (INDEPENDENT_AMBULATORY_CARE_PROVIDER_SITE_OTHER): Payer: Self-pay | Admitting: *Deleted

## 2010-08-31 NOTE — Progress Notes (Signed)
  Phone Note Outgoing Call Call back at [facility] Phone [phone]   Call placed by: Judithe Modest CMA,  August 09, 2010 4:58 PM Call placed to: Patient Details for Reason: Electronic refill request Summary of Call: Called pt regarding potassium.  Received electronic refill request for potassium and per pt medication list this medication was removed.  LMVM for the patient to call me back to find out if medication was removed in error or if the patient is still taking.  Judithe Modest CMA  August 09, 2010 4:59 PM  Initial call taken by: Judithe Modest CMA,  August 09, 2010 4:59 PM  Follow-up for Phone Call        Attempted to call again and got no answer.  LMOM Follow-up by: Judithe Modest CMA,  August 11, 2010 4:32 PM  Additional Follow-up for Phone Call Additional follow up Details #1::        Attempted call to patient.  LMOM.  No further refill requests sent.  No follow up at this time until pt contacts [place]. Additional Follow-up by: Judithe Modest CMA,  August 21, 2010 9:00 AM

## 2010-09-05 ENCOUNTER — Telehealth (INDEPENDENT_AMBULATORY_CARE_PROVIDER_SITE_OTHER): Payer: Self-pay | Admitting: *Deleted

## 2010-09-05 NOTE — Progress Notes (Signed)
Summary: called pt re potassium  Phone Note Outgoing Call Call back at [facility] Phone [phone]   Call placed by: Celestia Khat, CMA,  August 29, 2010 1:53 PM Call placed to: Patient  Follow-up for Phone Call        Called pt re Potassium , need to verify if she is currently taking this medication. Left her a message to call me back.  This is the 3rd refill request received re this med. [place], [place]  August 29, 2010 1:56 PM

## 2010-09-05 NOTE — Miscellaneous (Signed)
Summary: potassium chloride  Clinical Lists Changes   Medications: Added new medication of POTASSIUM CHLORIDE CR 10 MEQ CR-TABS (POTASSIUM CHLORIDE) take two capsules by mouth everyday. - Signed Rx of POTASSIUM CHLORIDE CR 10 MEQ CR-TABS (POTASSIUM CHLORIDE) take two capsules by mouth everyday.;  #60 x 6;  Signed;  Entered by: Celestia Khat, CMA;  Authorized by: Verne Carrow, MD;  Method used: Electronically to [facility]  [phone]*, [address], [place], [place], [place]  [address], Ph: [phone] or [phone], Fax: [phone]     Prescriptions: POTASSIUM CHLORIDE CR 10 MEQ CR-TABS (POTASSIUM CHLORIDE) take two capsules by mouth everyday.  #60 x 6   Entered by:   Celestia Khat, CMA   Authorized by:   Verne Carrow, MD   Signed by:   Celestia Khat, CMA on 08/31/2010   Method used:   Electronically to        [facility]  [phone]* (retail)       [address]       [place], [place]  [address]       Ph: [phone] or [phone]       Fax: [phone]   RxID:   [id number]

## 2010-09-05 NOTE — Progress Notes (Signed)
Summary: Phone: Potassium chloride  Phone Note Call from Patient   Caller: Patient Summary of Call: spoke with pt, she stated she is currently taking potassium chloride . added this medication to list and refilled at [facility] pharmacy. [place], [place]  August 31, 2010 2:07 PM

## 2010-09-10 LAB — GLUCOSE, CAPILLARY: Glucose-Capillary: 113 mg/dL — ABNORMAL HIGH (ref 70–99)

## 2010-09-10 LAB — POCT CARDIAC MARKERS: CKMB, poc: 2 ng/mL (ref 1.0–8.0)

## 2010-09-10 LAB — TSH: TSH: 0.007 u[IU]/mL — ABNORMAL LOW (ref 0.350–4.500)

## 2010-09-10 LAB — URINALYSIS, ROUTINE W REFLEX MICROSCOPIC
Bilirubin Urine: NEGATIVE
Ketones, ur: NEGATIVE mg/dL
Nitrite: NEGATIVE
Protein, ur: NEGATIVE mg/dL
Urobilinogen, UA: 2 mg/dL — ABNORMAL HIGH (ref 0.0–1.0)

## 2010-09-10 LAB — POCT TOXICOLOGY PANEL

## 2010-09-10 LAB — BASIC METABOLIC PANEL
CO2: 31 mEq/L (ref 19–32)
CO2: 31 mEq/L (ref 19–32)
Calcium: 10.4 mg/dL (ref 8.4–10.5)
Calcium: 10.6 mg/dL — ABNORMAL HIGH (ref 8.4–10.5)
Calcium: 10.6 mg/dL — ABNORMAL HIGH (ref 8.4–10.5)
Calcium: 9.4 mg/dL (ref 8.4–10.5)
Creatinine, Ser: 0.37 mg/dL — ABNORMAL LOW (ref 0.4–1.2)
Creatinine, Ser: 0.4 mg/dL (ref 0.4–1.2)
GFR calc Af Amer: >60 mL/min (ref >60)
GFR calc Af Amer: >60 mL/min (ref >60)
GFR calc Af Amer: >60 mL/min (ref >60)
GFR calc Af Amer: >60 mL/min (ref >60)
GFR calc non Af Amer: >60 mL/min (ref >60)
GFR calc non Af Amer: >60 mL/min (ref >60)
GFR calc non Af Amer: >60 mL/min (ref >60)
Potassium: 3.1 mEq/L — ABNORMAL LOW (ref 3.5–5.1)
Potassium: 4 mEq/L (ref 3.5–5.1)
Sodium: 139 mEq/L (ref 135–145)
Sodium: 141 mEq/L (ref 135–145)
Sodium: 141 mEq/L (ref 135–145)

## 2010-09-10 LAB — HEPATIC FUNCTION PANEL
Albumin: 3.1 g/dL — ABNORMAL LOW (ref 3.5–5.2)
Bilirubin, Direct: 0.2 mg/dL (ref 0.0–0.3)
Total Bilirubin: 0.7 mg/dL (ref 0.3–1.2)

## 2010-09-10 LAB — ETHANOL: Alcohol, Ethyl (B): <5 mg/dL (ref 0–10)

## 2010-09-10 LAB — CARDIAC PANEL(CRET KIN+CKTOT+MB+TROPI)
CK, MB: 1.6 ng/mL (ref 0.3–4.0)
Relative Index: INVALID (ref 0.0–2.5)
Relative Index: INVALID (ref 0.0–2.5)
Relative Index: INVALID (ref 0.0–2.5)
Total CK: 26 U/L (ref 7–177)
Total CK: 32 U/L (ref 7–177)
Total CK: 33 U/L (ref 7–177)
Troponin I: 0.25 ng/mL — ABNORMAL HIGH (ref 0.00–0.06)
Troponin I: 0.46 ng/mL — ABNORMAL HIGH (ref 0.00–0.06)

## 2010-09-10 LAB — PROTIME-INR
INR: 1.05 (ref 0.00–1.49)
Prothrombin Time: 13.6 seconds (ref 11.6–15.2)

## 2010-09-10 LAB — CBC
MCHC: 34.5 g/dL (ref 30.0–36.0)
RBC: 4.05 MIL/uL (ref 3.87–5.11)
WBC: 8.2 10*3/uL (ref 4.0–10.5)

## 2010-09-10 LAB — DIFFERENTIAL
Basophils Relative: 0 % (ref 0–1)
Monocytes Relative: 9 % (ref 3–12)
Neutro Abs: 5 10*3/uL (ref 1.7–7.7)
Neutrophils Relative %: 60 % (ref 43–77)

## 2010-09-10 LAB — MAGNESIUM: Magnesium: 1.4 mg/dL — ABNORMAL LOW (ref 1.5–2.5)

## 2010-09-10 LAB — LIPID PANEL
LDL Cholesterol: 76 mg/dL (ref 0–99)
Total CHOL/HDL Ratio: 3.5 RATIO
VLDL: 12 mg/dL (ref 0–40)

## 2010-09-10 LAB — POTASSIUM: Potassium: 2.8 mEq/L — ABNORMAL LOW (ref 3.5–5.1)

## 2010-09-10 LAB — POCT B-TYPE NATRIURETIC PEPTIDE (BNP): B Natriuretic Peptide, POC: 460 pg/mL — ABNORMAL HIGH (ref 0–100)

## 2010-09-10 LAB — APTT: aPTT: 33 seconds (ref 24–37)

## 2010-09-10 LAB — T4, FREE: Free T4: 3.67 ng/dL — ABNORMAL HIGH (ref 0.80–1.80)

## 2010-09-12 ENCOUNTER — Encounter: Payer: Self-pay | Admitting: Family Medicine

## 2010-09-12 LAB — CONVERTED CEMR LAB
AST: 15 units/L (ref 0–37)
Alkaline Phosphatase: 117 units/L (ref 39–117)
BUN: 10 mg/dL (ref 6–23)
Calcium: 9.6 mg/dL (ref 8.4–10.5)
Cholesterol: 299 mg/dL — ABNORMAL HIGH (ref 0–200)
Creatinine, Ser: 0.68 mg/dL (ref 0.40–1.20)
Free Thyroxine Index: 1.2 (ref 1.0–3.9)
HDL: 64 mg/dL (ref >39)
TSH: 26.266 microintl units/mL — ABNORMAL HIGH (ref 0.350–4.500)
Total Bilirubin: 0.9 mg/dL (ref 0.3–1.2)
Triglycerides: 63 mg/dL (ref <150)

## 2010-09-12 NOTE — Progress Notes (Signed)
  DDS Request received sent to [facility]  September 05, 2010 9:13 AM

## 2010-11-10 NOTE — H&P (Signed)
NAME:  [name], [name] NO.:  [id number]   MEDICAL RECORD NO.:  [id number]          PATIENT TYPE:  AMB   LOCATION:  SDC                           FACILITY:  [facility]   PHYSICIAN:  Juluis Mire, M.D.   DATE OF BIRTH:  1960-12-29   DATE OF ADMISSION:  DATE OF DISCHARGE:                                HISTORY & PHYSICAL   HISTORY OF PRESENT ILLNESS:  The patient is a 50 year old gravida 1, para 0,  abortus 1 single female who presents for hysteroscopy and NovaSure ablation  of the endometrium.  She is also going to have marsupialization of a  Bartholin cyst.   The patient has had a previous bilateral tubal ligation.  Her cycles are  regular.  She has four to five days of flow, four days being heavy.  She  changes pads every two to three hours.  Associated with this is clotting.  She has no discomfort.  In 2002, she had a laparoscopy that revealed  multiple uterine fibroids but no other abnormalities.  Her hemoglobin was  slightly depressed at 11.4.  During examination, a Bartholin cyst was noted  on the right side.  Otherwise, exam revealed the uterus to be upper limits  of normal size and slightly irregular, consistent with known fibroids.  We  went ahead and did a saline infusion ultrasound.  There were several just  small fibroids.  They were abutting the endometrial stripe but not impinging  upon it.  The saline infusion ultrasound was basically unremarkable, with no  intracavitary masses noted.  Again, there was no overt distortion of the  endometrium.  It was also noted that she had a Bartholin cyst, again, on the  right side.  Because of the menorrhagia, the patient has decided to proceed  with endometrial ablation.  Other alternatives include the use of birth  control pills versus __________ or IUD versus hysterectomy.  The patient  understands the options and wishes to try the ablative technique initially.   ALLERGIES:  NO KNOWN DRUG ALLERGIES.    MEDICATIONS:  1.  Zyrtec.  2.  Darvocet.  3.  Wellbutrin.  4.  Ambien.  5.  Clorazepate  6.  Lexapro.  7.  Imitrex.   PAST MEDICAL HISTORY:  She is under active management for migraine  headaches.  Otherwise, usual childhood diseases without any significant  sequelae.   PAST SURGICAL HISTORY:  1.  Bilateral tubal ligation.  2.  Tonsillectomy.   OBSTETRICAL HISTORY:  She has had one TAB.   FAMILY HISTORY:  Mother with a history of breast cancer.  She has a niece  with ovarian cancer.  Brother had lung cancer.   SOCIAL HISTORY:  No tobacco or alcohol use.   REVIEW OF SYSTEMS:  Noncontributory.   PHYSICAL EXAMINATION:  VITAL SIGNS:  The patient is afebrile with stable  vital signs.  HEENT:  The patient normocephalic.  Pupils are equal, round, and reactive to  light and accommodation.  Extraocular movements are intact.  Sclerae and  conjunctivae clear.  Oropharynx clear.  NECK:  Without thyromegaly.  BREASTS:  No  discrete masses.  LUNGS:  Clear.  CARDIAC:  Regular rate and rhythm without murmurs or gallops.  ABDOMEN:  Benign.  No masses or organomegaly or tenderness.  PELVIC:  Exam reveals a Bartholin cyst.  Otherwise, normal external  genitalia.  Vaginal mucosa clear.  Cervix unremarkable.  Uterus upper limits  of normal size, slightly irregular, consistent with known fibroids.  Adnexa  unremarkable.  EXTREMITIES:  Trace edema.  NEUROLOGIC:  Grossly within normal limits.   IMPRESSION:  1.  Menorrhagia.  2.  Bartholin cyst.   PLAN:  The patient will undergo hysteroscopic evaluation.  If there are no  significant abnormalities in the intrauterine cavity, will proceed with  endometrial ablation.  Will also do marsupialization of the Bartholin cyst.  Success rate for endometrial ablation is approximately 80-90%.  The risks  have been discussed, including vascular injury that could lead to hemorrhage  requiring transfusion or possible hysterectomy; bowel or associated  organ  injury that could require further exploratory surgery; risks of deep venous  thrombosis and pulmonary embolus.  With marsupialization, there is a chance  of recurrence.  There is a chance also of hemorrhage and infection.  The  patient voiced understanding of the indications, risks, and options.      JSM/MEDQ  D:  09/07/2004  T:  09/07/2004  Job:  [id number]

## 2010-11-10 NOTE — H&P (Signed)
NAME:  [name], [name] NO.:  [id number]   MEDICAL RECORD NO.:  [id number]          PATIENT TYPE:  AMB   LOCATION:  SDC                           [facility]ACILITY:  [facility]   PHYSICIAN:  Juluis Mire, M.D.   DATE OF BIRTH:  [birth year]   DATE OF ADMISSION:  02/21/2006  DATE OF DISCHARGE:                                HISTORY & PHYSICAL   HISTORY OF PRESENT ILLNESS:  The patient is a 50 year old gravida 1, para 0,  abortus 1, female who presents for laparoscopic-assisted vaginal  hysterectomy.   In relation to the present admission, the patient is know to have uterine  fibroids.  She was initially seen in our practice in 2002.  She had had a  previous bilateral tubal ligation.  Was experiencing marked increase in  menstrual flow.  She underwent a subsequent saline infusion ultrasound that  was basically unremarkable.  She did have uterine irregularities consistent  with know fibroids on exam, and this was confirmed on ultrasound, although,  none of that seemed to be significant.  She underwent a NovaSure ablation  for the menorrhagia.  Subsequently, she has done well but has been  experiencing increasing pain and discomfort, particularly on the left side.  Follow up ultrasound has confirmed enlarging fibroid on that side measuring  approximately 5 cm.  In view of persistent pain and discomfort, the patient  decided to proceed with laparoscopic-assisted vaginal hysterectomy.  Other  options have been discussed including radiologic embolization, continued  conservative management or myomectomy.   ALLERGIES:  LATEX.   MEDICATIONS:  None.   PAST MEDICAL HISTORY:  1. History of migraine headache, under active management.  2. Usual childhood diseases.   PAST SURGICAL HISTORY:  1. Previous bilateral tubal ligation.  2. Previous tonsillectomy.  3. Previous hysterectomy and NovaSure ablation.  4. Had previous marsupialization of a Bartholin's cyst.   OBSTETRIC HISTORY:  One  TAB.   FAMILY HISTORY:  Mother with history of breast cancer.  She has a niece with  ovarian cancer.  Brother had lung cancer.   SOCIAL HISTORY:  No tobacco, alcohol use.   REVIEW OF SYSTEMS:  Noncontributory.   PHYSICAL EXAMINATION:  VITAL SIGNS:  Afebrile, stable vital signs.  HEENT:  The patient is normocephalic.  Pupils equal, round and reactive to  light and accommodations.  Extraocular muscles intact.  Sclerae and  conjunctivae were clear.  Oropharynx clear.  NECK:  Without thyromegaly.  BREASTS:  No discrete masses.  LUNGS:  Clear.  CARDIOVASCULAR:  Regular rate and rhythm without murmurs or gallops.  ABDOMEN:  Benign.  No masses, organomegaly or tenderness.  PELVIC:  Normal external genitalia.  Vaginal mucosa is clear.  Cervix is  unremarkable.  Uterus enlarged.  There is an anterior wall fibroid measuring  approximately 5 cm.  Adnexa unremarkable.  RECTOVAGINAL:  Clear.  EXTREMITIES:  Trace edema.  NEUROLOGICAL:  Grossly within normal limits.   IMPRESSION:  Enlarging uterine fibroid with associated symptomatology.   PLAN:  The patient will undergo laparoscopic-assisted vaginal hysterectomy.  Again, other alternatives have been discussed.  The risks of surgery  have  been explained including the risk of infection, the risk of hemorrhage that  could require transfusion with the risk of AIDS or hepatitis, risk of injury  to adjacent organs (This could include bladder, bowel or ureters that would  require further exploratory surgery.), the risk of deep vein thrombosis and  pulmonary embolus.  The patient expressed understanding of indications,  risks and other options.      Juluis Mire, M.D.  Electronically Signed     JSM/MEDQ  D:  02/21/2006  T:  02/21/2006  Job:  [id number]

## 2010-11-10 NOTE — Op Note (Signed)
[name], [name] NO.:  [id number]   MEDICAL RECORD NO.:  [id number]          PATIENT TYPE:  OIB   LOCATION:  9312                          [facility]ACILITY:  [facility]   PHYSICIAN:  Juluis Mire, M.D.   DATE OF BIRTH:  [birth year]   DATE OF PROCEDURE:  02/21/2006  DATE OF DISCHARGE:                                 OPERATIVE REPORT   PREOPERATIVE DIAGNOSIS:  Uterine fibroids.   POSTOPERATIVE DIAGNOSIS:  Uterine fibroids.   OPERATIVE PROCEDURE:  Laparoscopic assisted vaginal hysterectomy.   SURGEON:  Juluis Mire, M.D.   ASSISTANT:  Zelphia Cairo, M.D.   ANESTHESIA:  General endotracheal anesthesia.   ESTIMATED BLOOD LOSS:  300-400 mL.   PACKS AND DRAINS:  None.   BLOOD REPLACED:  None.   COMPLICATIONS:  None.   INDICATIONS FOR PROCEDURE:  As noted in the history and physical.   PROCEDURE IN DETAIL:  The patient was taken to the OR and placed in the  supine position.  After satisfactory oral general endotracheal anesthesia  was obtained, the patient was placed in the dorsal lithotomy position using  the Allen stirrups.  The abdomen, perineum, and vagina were prepped out with  Betadine.  The bladder was emptied by in and out catheterization.  A Hulka  tenaculum was put in place and secured.  The patient was then draped in a  sterile field.  A subumbilical incision was made with the knife and extended  down to the fascia.  The fascia was entered sharply and the incision in the  fascia extended laterally.  Using blunt pressure, the muscles were separated  and the peritoneum was entered.  The taut laparoscopic trocar was put in  place and inflated.  The laparoscope was then introduced, there was no  evidence of injury to adjacent organs.  The appendix was visualized and  noted to be normal.  The upper abdomen including liver and tip of the  gallbladder were clear.  The uterus was enlarged with multiple uterine  fibroids.  The right ovary was normal.  The left  ovary was somewhat adherent  to the left pelvic sidewall but well out of the cul-de-sac.  Next, a 5 mm  trocar was put in place in the suprapubic area.  Using the Gyrus bipolar, we  first went to the right side.  We separated the right utero-ovarian pedicle  using cautery incision.  The right tube and mesosalpinx were cauterized and  incised.  The right round ligament was cauterized and incised.  We then went  to the left side.  The left utero-ovarian pedicle was cauterized and  incised.  The left tube and mesosalpinx were cauterized and incised.  The  left round ligament was cauterized and incised.  We then continued the  separation of the broad ligament from the side of the uterus using the Gyrus  and cautery incision.  We took it down and then developed a bladder flap.  We had good hemostasis.  We felt the uterus was fairly free at this point in  time.  The abdomen was deflated of its carbon dioxide,  the laparoscope was  removed.   The patient's legs were repositioned.  The Hulka tenaculum was taken out.  A  weighted speculum was placed in the vaginal vault.  The cervix was grasped  with a Christella Hartigan tenaculum.  The cul-de-sac was entered sharply.  The  uterosacral ligaments were clamped, cut, and suture ligated with 0 Vicryl.  The reflection of the vaginal mucosa anteriorly was incised.  The bladder  was dissected superiorly.  Using the Gyrus cautery system, we first  cauterized and incised the paracervical tissue.  Then, using cautery  incision, the parametrium was serially separated from the sides of the  uterus.  We then began morcellating the uterus.  We first removed the cervix  and lower uterine stump.  We then went to the left side of the uterus and  removed areas of the myometrium, the same on the right.  We were then able  to flip the uterus.  The remaining pedicles were clamped and incised and the  whole specimen was passed off the operative field.  At this point in time,  we  had good hemostasis.  A uterosacral plication stitch was put in place and  secured.  The vaginal mucosa was reapproximated in a vertical fashion with  interrupted figure-of-eight sutures of 0 Vicryl.  A Foley catheter was  placed to straight drain with clear urine and a sponge on a sponge stick was  placed in the vaginal vault.   The legs were repositioned, the laparoscope was re-introduced, the abdomen  was reinsufflated with carbon dioxide.  Some oozing was noted from the cuff  and was controlled with the bipolar.  The tubes and ovaries were  unremarkable.  We thoroughly irrigated the pelvis.  We had excellent  hemostasis.  Urine output remained clear and adequate. No evidence of injury  noted to adjacent organs.  The abdomen was desufflated of carbon dioxide,  all trocars were removed.  The subumbilical fascia was closed with two  figure-of-eight sutures of 0 Vicryl, the skin was closed with interrupted  subcuticular 4-0 Vicryl.  The suprapubic incision was closed with Dermabond.  At this point in time, the sponge on a sponge stick was removed from the  vaginal vault.  The patient was taken out of the dorsal lithotomy position.  Once extubated and alert, she was transferred to the recovery room in good  condition.  Sponge, instrument, and needle counts was reported correct by  the circulating nurse x 2.  Again, Foley catheter remained clear at the time  of closure.      Juluis Mire, M.D.  Electronically Signed     JSM/MEDQ  D:  02/21/2006  T:  02/21/2006  Job:  [id number]

## 2010-11-10 NOTE — Discharge Summary (Signed)
[name], [name] NO.:  [id number]   MEDICAL RECORD NO.:  [id number]          PATIENT TYPE:  OIB   LOCATION:  9312                          [facility]ILITY[facility]   PHYSICIAN:  Juluis Mire, M.D.   DATE OF BIRTH:  15-Apr-1961   DATE OF ADMISSION:  02/21/2006  DATE OF DISCHARGE:  02/22/2006                                 DISCHARGE SUMMARY   ADMITTING DIAGNOSIS:  Uterine fibroid.   DISCHARGE DIAGNOSIS:  Uterine fibroid.   OPERATIVE PROCEDURE:  Laparoscopic assisted vaginal hysterectomy.   For complete history and physical please see dictated note.   COURSE IN THE HOSPITAL:  The patient went the above-noted surgery.  Pathology is still pending. She did well postoperatively.  Postop hemoglobin  11.2, discharged home on her first postop day.  At that time she was  tolerating a regular diet.  She is voiding without difficulty.  Her  abdominal exam was benign.  Bowel sounds were active.  All incisions were  clear; and she was having no active vaginal bleeding.   __________  stay in the hospital.  The patient was discharged home in stable  condition.   DISPOSITION:  Routine instructions.  She was to avoid heavy lifting,  __________ or driving a car.  Discharged home with Tylox as needed for pain;  and to continue routine medications.  She is to watch for signs of  infection, nausea, vomiting, increasing abdominal pain, active vaginal  bleeding, or signs of deep venous thrombosis.  Followup will be in the  office in 1 week.      Juluis Mire, M.D.  Electronically Signed     JSM/MEDQ  D:  02/22/2006  T:  02/22/2006  Job:  [id number]

## 2010-11-10 NOTE — Discharge Summary (Signed)
[name], [name] NO.:  [id number]   MEDICAL RECORD NO.:  [id number]          PATIENT TYPE:  IPS   LOCATION:  0504                          FACILITY:  [facility]   PHYSICIAN:  Geoffery Lyons, M.D.      DATE OF BIRTH:  [birth year]   DATE OF ADMISSION:  08/08/2004  DATE OF DISCHARGE:  08/10/2004                                 DISCHARGE SUMMARY   CHIEF COMPLAINT AND PRESENT ILLNESS:  This was the first admission to [facility] for this 50 year old, single, African-American  female admitted.  History of depression, increased anxiety, history of  migraines which she thought was the cause of the depression, fleeting  suicidal ideas.  Went to the emergency room for migraines that she could not  break with the medication.  Has been isolated in bed, vomiting, lost 30  pounds, sleeping fair.  Did not want to be in the unit, did not believe that  she had to be there.   PAST PSYCHIATRIC HISTORY:  First time at [facility].  Sees  __________.   ALCOHOL AND DRUG HISTORY:  Denies the use or abuse of any substances.   PAST MEDICAL HISTORY:  Migraines.   MEDICATIONS:  1.  Wellbutrin XL 300 mg.  2.  Clorazepate 7.5 three times a day.  3.  Ambien 10 at night for sleep.  4.  Darvocet as needed.  5.  Nadolol 20 mg daily.  6.  Zyrtec 10 mg daily,  7.  Hydrocodone 5/500 one to two q.6h. as needed.   PHYSICAL EXAMINATION:  Performed and failed show any acute findings.   LABORATORY DATA:  CBC showed Hemoglobin 11.0, hematocrit 32.8, blood  chemistry within normal limits.  TSH 1.541.   MENTAL STATUS EXAM:  The patient is an alert, female, cooperative with fair  eye contact.  Basically __________ mood fine, unhappy for being in the unit.  Affect flat, upset, __________.  Denied any suicidal or homicidal ideas.  There were no delusions.  There were no hallucinations.  Cognition well  preserved.  ADMISSION DIAGNOSIS   AXIS I:  1.  Depressive disorder,  unspecified.  2.  Adjustment disorder with depression.   AXIS II:  No diagnosis.   AXIS III:  Migraines.   AXIS IV:  Moderate.   AXIS V:  1.  Global assessment of functioning upon admission:  5.  2.  Highest global assessment of functioning in the last year:  90.   HOSPITAL COURSE:  She was admitted and started in individual and group  psychotherapy.  She was maintained on her medications. She was given some  Ambien for sleep.  She claimed that it was a mistake to come to the unit  __________ with the migraine headaches. The migraines affect her and said  that she might have said that she wanted to die if she was to continue to  have headaches, but she never said that she was going to kill herself.  She  says that she will not hurt herself.  Admitted to being frustrated because  of the headache and wanted to  go home.  She was going to see her therapist  and Dr. Ladona Ridgel.  She was in full contact with reality.  Was not having  suicidal or homicidal ideations and she was comitted to outpatient treatment  with outpatient followup.  DISCHARGE DIAGNOSIS   AXIS I:  Depressive disorder, not otherwise specified.   AXIS II:  No diagnosis.   AXIS III:  Migraines.   AXIS IV:  Moderate.   AXIS V:  Global assessment of functioning on discharge:  55-60.   DISCHARGE MEDICATIONS:  1.  Wellbutrin XL 300 mg per day.  2.  Clorazepate 7.5 one three times a day.  3.  Nadolol 20 mg daily.  4.  Zyrtec 10 mg daily,   FOLLOW UP:  With Dr. Lorenz Coaster at [facility] for  intensive outpatient program.      IL/MEDQ  D:  09/06/2004  T:  09/07/2004  Job:  [id number]

## 2010-11-10 NOTE — Op Note (Signed)
NAMEBELMIRA, DALEY NO.:  [id number]   MEDICAL RECORD NO.:  [id number]          PATIENT TYPE:  AMB   LOCATION:  SDC                           FACILITY:  [facility]   PHYSICIAN:  Juluis Mire, M.D.   DATE OF BIRTH:  07/22/1960   DATE OF PROCEDURE:  09/07/2004  DATE OF DISCHARGE:                                 OPERATIVE REPORT   PREOPERATIVE DIAGNOSES:  1.  Menorrhagia.  2.  Right Bartholin's cyst.   POSTOPERATIVE DIAGNOSES:  1.  Menorrhagia.  2.  Right Bartholin's cyst.   OPERATIVE PROCEDURES:  1.  Hysteroscopy.  2.  Endometrial curettings.  3.  NovaSure ablation.  4.  Marsupialization of right Bartholin's cyst.   SURGEON:  Juluis Mire, M.D.   ANESTHESIA:  General.   ESTIMATED BLOOD LOSS:  Minimal.   PACKS AND DRAINS:  None.   INTRAOPERATIVE BLOOD REPLACED:  None.   COMPLICATIONS:  None.   INDICATIONS:  Dictated in the history and physical.   The procedure was as follows:  The patient was taken to the OR and placed in  the supine position.  After a satisfactory level of general laryngeal  anesthesia obtained, the patient was placed in the dorsal lithotomy position  using the Allen stirrups.  The patient's vulvar area and vaginal area were  prepped out with Betadine and draped in a sterile field.  A speculum was  placed in the vaginal vault.  The cervix was grasped with a single-tooth  tenaculum.  Endocervical length was 4 cm.  Endometrial length was 8 cm.  Cervix serially dilated to a size 27 Pratt dilator.  Diagnostic hysteroscope  was introduced into the intrauterine cavity.  The intrauterine cavity was  distended using lactated Ringer's.  Visualization revealed a smooth  endometrial cavity with no evidence of abnormalities.  At this point in time  the hysteroscope was then removed.  The NovaSure was inserted and  appropriately expanded.  Total width of the intrauterine cavity was 4.7 cm.  The CO2 test passed.  Subsequent ablation was  continued for 1 minute 52  seconds with a power of 103.  The NovaSure was then removed intact.  We did  have some bleeding from the anterior lip of the cervix from the tenaculum  site.  It was brought under control with a figure-of-eight of 2-0 chromic.  Next we went to the right Bartholin's cyst.  We entered it.  A large amount  of purulent material was obtained.  We identified the cyst wall.  It was  sutured to the outside of the skin in marsupialization fashion with  interrupted sutures of 3-0 Vicryl.  With this we had good hemostasis and  adequate marsupialization.  There was no active bleeding at the present  time.  Sponge, instrument and needle count reported as correct by  circulating nurse.  The patient tolerated the procedure well, was returned  to the recovery room in good condition.      JSM/MEDQ  D:  09/07/2004  T:  09/07/2004  Job:  [id number]

## 2010-12-22 ENCOUNTER — Other Ambulatory Visit (HOSPITAL_COMMUNITY): Payer: Self-pay | Admitting: Family Medicine

## 2010-12-22 DIAGNOSIS — Z1231 Encounter for screening mammogram for malignant neoplasm of breast: Secondary | ICD-10-CM

## 2011-01-09 ENCOUNTER — Ambulatory Visit (HOSPITAL_COMMUNITY)
Admission: RE | Admit: 2011-01-09 | Discharge: 2011-01-09 | Disposition: A | Discharge status: Home / Self Care | Payer: Self-pay | Source: Ambulatory Visit | Attending: Family Medicine | Admitting: Family Medicine

## 2011-01-09 DIAGNOSIS — Z1231 Encounter for screening mammogram for malignant neoplasm of breast: Secondary | ICD-10-CM | POA: Insufficient documentation

## 2011-11-19 ENCOUNTER — Emergency Department (HOSPITAL_BASED_OUTPATIENT_CLINIC_OR_DEPARTMENT_OTHER)
Admission: EM | Admit: 2011-11-19 | Discharge: 2011-11-19 | Disposition: A | Discharge status: Home / Self Care | Payer: Self-pay | Attending: Emergency Medicine | Admitting: Emergency Medicine

## 2011-11-19 ENCOUNTER — Encounter (HOSPITAL_BASED_OUTPATIENT_CLINIC_OR_DEPARTMENT_OTHER): Payer: Self-pay | Admitting: *Deleted

## 2011-11-19 DIAGNOSIS — I2510 Atherosclerotic heart disease of native coronary artery without angina pectoris: Secondary | ICD-10-CM | POA: Insufficient documentation

## 2011-11-19 DIAGNOSIS — W540XXA Bitten by dog, initial encounter: Secondary | ICD-10-CM | POA: Insufficient documentation

## 2011-11-19 DIAGNOSIS — S41009A Unspecified open wound of unspecified shoulder, initial encounter: Secondary | ICD-10-CM | POA: Insufficient documentation

## 2011-11-19 DIAGNOSIS — I252 Old myocardial infarction: Secondary | ICD-10-CM | POA: Insufficient documentation

## 2011-11-19 HISTORY — DX: Thyrotoxicosis with diffuse goiter without thyrotoxic crisis or storm: E05.00

## 2011-11-19 MED ORDER — TETANUS-DIPHTH-ACELL PERTUSSIS 5-2.5-18.5 LF-MCG/0.5 IM SUSP
0.5000 mL | Freq: Once | INTRAMUSCULAR | Status: AC
  Administered 2011-11-19: 0.5 mL via INTRAMUSCULAR
  Filled 2011-11-19: qty 0.5

## 2011-11-19 MED ORDER — AMOXICILLIN-POT CLAVULANATE 500-125 MG PO TABS: 1.0000 | ORAL_TABLET | Freq: Three times a day (TID) | ORAL | Status: AC

## 2011-11-19 MED ORDER — HYDROCODONE-ACETAMINOPHEN 5-325 MG PO TABS: 2.0000 | ORAL_TABLET | ORAL | Status: AC | PRN

## 2011-11-19 NOTE — Discharge Instructions (Signed)
For Animal Control emergencies, questions or comments, call   [phone].  After hours, call 911. If a vicious dog is running at large, Pharmacist, hospital are dispatched and will respond to the call. Also, 911 Dispatch will contact the on-call Lexicographer to assist in the emergency.        Animal Bite Animal bite wounds can get infected. It is important to get proper medical treatment. Ask your doctor if you need a rabies shot. HOME CARE   Follow your doctor's instructions for taking care of your wound.   Only take medicine as told by your doctor.   Take your medicine (antibiotics) as told. Finish them even if you start to feel better.   Keep all doctor visits as told.  You may need a tetanus shot if:   You cannot remember when you had your last tetanus shot.   You have never had a tetanus shot.   The injury broke your skin.  If you need a tetanus shot and you choose not to have one, you may get tetanus. Sickness from tetanus can be serious. GET HELP RIGHT AWAY IF:   Your wound is warm, red, sore, or puffy (swollen).   You notice yellowish-white fluid (pus) or a bad smell coming from the wound.   You see a red line on the skin coming from the wound.   You have a fever, chills, or you feel sick.   You feel sick to your stomach (nauseous), or you throw up (vomit).   Your pain does not go away, or it gets worse.   You have trouble moving the injured part.   You have questions or concerns.  MAKE SURE YOU:   Understand these instructions.   Will watch your condition.   Will get help right away if you are not doing well or get worse.  Document Released: 06/11/2005 Document Revised: 05/31/2011 Document Reviewed: 01/31/2011 [facility] Patient Information 2012 [place], [place].

## 2011-11-19 NOTE — ED Provider Notes (Signed)
History  This chart was scribed for Nelia Shi, MD by Cherlynn Perches. The patient was seen in room MH10/MH10. Patient's care was started at 1833.  CSN: [id number]  Arrival date & time 11/19/11  4098   First MD Initiated Contact with Patient 11/19/11 1849      Chief Complaint  Patient presents with  . Animal Bite    (Consider location/radiation/quality/duration/timing/severity/associated sxs/prior treatment) HPI  [name] is a 51 y.o. female who presents to the Emergency Department complaining of 1 hour of a sudden onset, constant wound to the upper left arm with associated bruising of the surrounding area. Pt reports that she was walking her dog this afternoon when a neighbor's dog bit her arm. Pt believes that the dog was a pit bull. Pt does not know if the animal has had his shots. Pt is unsure of last tetanus shot.    Past Medical History  Diagnosis Date  . Myocardial infarction   . Coronary artery disease   . Grave's disease     History reviewed. No pertinent past surgical history.  History reviewed. No pertinent family history.  History  Substance Use Topics  . Smoking status: Not on file  . Smokeless tobacco: Not on file  . Alcohol Use:     OB History    Grav Para Term Preterm Abortions TAB SAB Ect Mult Living                  Review of Systems  All other systems reviewed and are negative.    Allergies  Latex  Home Medications   Current Outpatient Rx  Name Route Sig Dispense Refill  . ASPIRIN 325 MG PO TABS Oral Take 325 mg by mouth daily.    Marland Kitchen METHIMAZOLE 10 MG PO TABS Oral Take 10 mg by mouth 3 (three) times daily.    Marland Kitchen METOPROLOL TARTRATE 50 MG PO TABS Oral Take 50 mg by mouth 2 (two) times daily.    Marland Kitchen NITROGLYCERIN 0.4 MG SL SUBL Sublingual Place 0.4 mg under the tongue every 5 (five) minutes as needed.    Marland Kitchen POTASSIUM CHLORIDE ER 10 MEQ PO TBCR Oral Take 20 mEq by mouth 2 (two) times daily.    . AMOXICILLIN-POT CLAVULANATE 500-125 MG  PO TABS Oral Take 1 tablet (500 mg total) by mouth every 8 (eight) hours. 21 tablet 0  . HYDROCODONE-ACETAMINOPHEN 5-325 MG PO TABS Oral Take 2 tablets by mouth every 4 (four) hours as needed for pain. 10 tablet 0    Triage Vitals: BP 149/94  Pulse 77  Temp(Src) 98.3 F (36.8 C) (Oral)  Resp 20  Ht 5\' 3"  (1.6 m)  Wt 211 lb (95.709 kg)  BMI 37.38 kg/m2  SpO2 100%  Physical Exam  Nursing note and vitals reviewed. Constitutional: She is oriented to person, place, and time. She appears well-developed and well-nourished.  HENT:  Head: Normocephalic and atraumatic.  Eyes: Conjunctivae are normal. Right eye exhibits no discharge. Left eye exhibits no discharge.  Neck: Normal range of motion. Neck supple.  Cardiovascular: Normal rate.   Pulmonary/Chest: Effort normal. She exhibits no tenderness.  Abdominal: Soft.  Musculoskeletal: She exhibits no tenderness.       Two puncture wounds to upper left arm with surrounding ecchymosis, no active bleeding, no tenderness noted.  Neurological: She is alert and oriented to person, place, and time.  Skin: Skin is warm. No rash noted.  Psychiatric: She has a normal mood and affect. Her behavior is normal.  ED Course  Procedures (including critical care time)  DIAGNOSTIC STUDIES: Oxygen Saturation is 100% on room air, normal by my interpretation.    COORDINATION OF CARE: 6:53PM - Advised pt to find the owner of the dog and find out if he has had his shots. If not, pt should call animal control. Pt agrees with plan.    Labs Reviewed - No data to display No results found.   1. Dog bite       MDM  I personally performed the services described in this documentation, which was scribed in my presence. The recorded information has been reviewed and considered.    Nelia Shi, MD 11/19/11 [phone]

## 2011-11-19 NOTE — ED Notes (Signed)
Pt. Has been seen by Dr. Radford Pax EDP and is to be discharged.

## 2011-11-19 NOTE — ED Notes (Signed)
Pt amb to triage with quick steady gait in nad. Pt reports dog bite by unknown dog in her apartment complex today.

## 2011-12-25 ENCOUNTER — Ambulatory Visit (INDEPENDENT_AMBULATORY_CARE_PROVIDER_SITE_OTHER): Payer: Managed Care, Other (non HMO) | Admitting: Nurse Practitioner

## 2011-12-25 ENCOUNTER — Encounter (INDEPENDENT_AMBULATORY_CARE_PROVIDER_SITE_OTHER): Payer: Managed Care, Other (non HMO)

## 2011-12-25 ENCOUNTER — Encounter: Payer: Self-pay | Admitting: Nurse Practitioner

## 2011-12-25 VITALS — BP 128/84 | HR 62 | Ht 62.0 in | Wt 205.4 lb

## 2011-12-25 DIAGNOSIS — R0989 Other specified symptoms and signs involving the circulatory and respiratory systems: Secondary | ICD-10-CM

## 2011-12-25 DIAGNOSIS — R0609 Other forms of dyspnea: Secondary | ICD-10-CM

## 2011-12-25 DIAGNOSIS — I82409 Acute embolism and thrombosis of unspecified deep veins of unspecified lower extremity: Secondary | ICD-10-CM

## 2011-12-25 DIAGNOSIS — R0789 Other chest pain: Secondary | ICD-10-CM

## 2011-12-25 DIAGNOSIS — R609 Edema, unspecified: Secondary | ICD-10-CM

## 2011-12-25 DIAGNOSIS — R072 Precordial pain: Secondary | ICD-10-CM

## 2011-12-25 DIAGNOSIS — M79606 Pain in leg, unspecified: Secondary | ICD-10-CM

## 2011-12-25 DIAGNOSIS — M79609 Pain in unspecified limb: Secondary | ICD-10-CM

## 2011-12-25 DIAGNOSIS — R0600 Dyspnea, unspecified: Secondary | ICD-10-CM

## 2011-12-25 DIAGNOSIS — M79604 Pain in right leg: Secondary | ICD-10-CM | POA: Insufficient documentation

## 2011-12-25 DIAGNOSIS — R600 Localized edema: Secondary | ICD-10-CM | POA: Insufficient documentation

## 2011-12-25 NOTE — Patient Instructions (Addendum)
Your physician recommends that you schedule a follow-up appointment in: 1 month with Dr Clifton James Your physician has requested that you have en exercise stress myoview. For further information please visit [email or web address] Please follow instruction sheet, as given.  Your physician has requested that you have an echocardiogram. Echocardiography is a painless test that uses sound waves to create images of your heart. It provides your doctor with information about the size and shape of your heart and how well your heart's chambers and valves are working. This procedure takes approximately one hour. There are no restrictions for this procedure.  Your physician has requested that you have a lower extremity venous duplex. This test is an ultrasound of the veins in the legs or arms. It looks at venous blood flow that carries blood from the heart to the legs or arms. Allow one hour for a Lower Venous exam. There are no restrictions or special instructions.

## 2011-12-25 NOTE — Progress Notes (Signed)
Patient Name: [name] Date of Encounter: 12/25/2011  Primary Care Provider:  Evette Georges, MD Primary Cardiologist:  C. Clifton James, MD  Patient Profile  51 year old female with history of hyperthyroidism and chest pain in the past who presents with recurrence of chest pain and dyspnea.  Problem List   Past Medical History  Diagnosis Date  . Grave's disease   . Midsternal chest pain     a. 06/2009 elevated trop in setting of tachycardia/hyperthyroidism;  b. 06/2009 nonischemic myoview;  c. recurrent c/p cath scheduled and pt cancelled.  Marland Kitchen HTN (hypertension)   . Palpitations     a. 48 hr holter 08/2009 - RSR/SB   Past Surgical History  Procedure Date  . Abdominal hysterectomy   . Tonsillectomy 1990    `    Allergies  Allergies  Allergen Reactions  . Latex Itching    HPI  51 year old female with a problem list.  In January 2011 she was admitted with hyperthyroidism, tachycardia, and elevated troponin.  Stress testing was normal and it was felt that elevation of troponin was demand ischemia in the setting of tachycardia.  She has been medically managed.  In March of 2011 she had more chest pain and initially decision was made to perform catheterization however she later canceled.  On Memorial Day this year, she was attacked by a pitbull.  She sustained some bites and was evaluated in the emergency room but is otherwise recovered fine.  Since then however she has been experiencing intermittent 6/10 left chest squeezing that occurs almost exclusively at rest especially when lying in bed at night occasionally associated with shortness of breath and always associated with anxiety.  The discomfort is somewhat positional in nature and resolved within 15 minutes.  Over the same period of time, she has noted some mild dyspnea on exertion as well as mild lower extremity edema which she thinks it affects her right leg more than her left.  She also has noted pain in her right  leg/calf.  She's concerned that she may be developing symptoms of heart failure.  She chronically sleeps with her head of bed elevated secondary to Graves' disease and exophthalmos.  She denies PND, early satiety, dizziness, or syncope.  Home Medications  Prior to Admission medications   Medication Sig Start Date End Date Taking? Authorizing Provider  aspirin 325 MG tablet Take 325 mg by mouth daily.   Yes Historical Provider, MD  clonazePAM (KLONOPIN) 0.5 MG tablet Take 0.5 mg by mouth 2 (two) times daily as needed.   Yes Historical Provider, MD  DUEXIS 800-26.6 MG TABS 3 (three) times daily. 800-26.6 12/04/11  Yes Historical Provider, MD  methimazole (TAPAZOLE) 10 MG tablet Take 10 mg by mouth 3 (three) times daily.   Yes Historical Provider, MD  metoprolol (LOPRESSOR) 50 MG tablet Take 50 mg by mouth 2 (two) times daily.   Yes Historical Provider, MD  nitroGLYCERIN (NITROSTAT) 0.4 MG SL tablet Place 0.4 mg under the tongue every 5 (five) minutes as needed.   Yes Historical Provider, MD  potassium chloride (K-DUR) 10 MEQ tablet Take 20 mEq by mouth daily.    Yes Historical Provider, MD    Review of Systems  Intermittent chest pain, dyspnea, anxiety, lower extremity edema as outlined above.  All other systems reviewed and are otherwise negative except as noted above.  Physical Exam  Blood pressure 128/84, pulse 62, height 5\' 2"  (1.575 m), weight 205 lb 6.4 oz (93.169 kg).  General: Pleasant, NAD  Psych: Normal affect. Neuro: Alert and oriented X 3. Moves all extremities spontaneously. HEENT: Normal  Neck: Supple without bruits or JVD. Lungs:  Resp regular and unlabored, CTA. Heart: RRR no s3, s4, or murmurs. Abdomen: Soft, non-tender, non-distended, BS + x 4.  Extremities: No clubbing, cyanosis.  She has trace bilateral lower extremity edema affecting her ankles.  The right might be slightly worse than the left.  She reports tenderness upon palpation of the right lower leg and calf.  No  palpable cord is noted.  DP/PT/Radials 2+ and equal bilaterally.  Accessory Clinical Findings  ECG - sinus bradycardia, 54, no acute ST or T changes.  Assessment & Plan  1.  Chest pain: Patient presents with somewhat atypical chest discomfort occurring almost exclusively at rest or at bedtime.  There does appear to be a positional component to her symptoms.  It is sometimes associated with dyspnea.  It has been over 2 years since her last diagnostic evaluation and I have arranged for an exercise Myoview to rule out ischemia.  I would not pursue catheterization unless she has a significantly abnormal Myoview.  She has nitroglycerin to use on a p.r.n. Basis and has not had to use this.  She continues to take aspirin and beta blocker therapy.  2.  Dyspnea and lower extremity edema: Patient is very mild lower extremity edema though she feels it is worse than it appears on exam.  It should be noted that she sits for 8 hours at work each day.  I recommended that she elevate her legs whenever possible that some of her perceived swelling may be related to venous insufficiency.  We will obtain a 2-D echocardiogram to re\re documented LV function which was previously normal.  Blood pressure heart rate are within normal limits and thus I don't think she is having diastolic failure.  3.  Right lower extremity pain and swelling: As noted above, patient has mild lower extremity edema which is slightly worse in the right and is associated with tenderness on that side.  She is very concerned about the possibility of clot.  I have arranged for a venous ultrasound to rule out DVT today.  4.  Hyperthyroidism: This is followed by endocrinology.  She remains on methimazole and beta blocker therapy.  5.  Disposition: Testing as above.  Follow up with Dr. Clifton James next month procedure if necessary.   Nicolasa Ducking, NP 12/25/2011, 5:40 PM

## 2011-12-31 ENCOUNTER — Encounter: Payer: Self-pay | Admitting: Nurse Practitioner

## 2012-01-01 ENCOUNTER — Other Ambulatory Visit (HOSPITAL_COMMUNITY): Payer: Managed Care, Other (non HMO)

## 2012-01-14 ENCOUNTER — Encounter (HOSPITAL_COMMUNITY): Payer: Managed Care, Other (non HMO)

## 2012-02-15 ENCOUNTER — Ambulatory Visit: Payer: Managed Care, Other (non HMO) | Admitting: Cardiovascular Disease

## 2012-05-02 ENCOUNTER — Other Ambulatory Visit: Payer: Self-pay | Admitting: Internal Medicine

## 2012-05-02 DIAGNOSIS — E0500 Thyrotoxicosis with diffuse goiter without thyrotoxic crisis or storm: Secondary | ICD-10-CM | POA: Insufficient documentation

## 2012-05-26 ENCOUNTER — Encounter (HOSPITAL_COMMUNITY)
Admission: RE | Admit: 2012-05-26 | Discharge: 2012-05-26 | Disposition: A | Discharge status: Home / Self Care | Payer: Managed Care, Other (non HMO) | Source: Ambulatory Visit | Attending: Internal Medicine | Admitting: Internal Medicine

## 2012-05-26 DIAGNOSIS — E0590 Thyrotoxicosis, unspecified without thyrotoxic crisis or storm: Secondary | ICD-10-CM | POA: Insufficient documentation

## 2012-05-26 DIAGNOSIS — E0500 Thyrotoxicosis with diffuse goiter without thyrotoxic crisis or storm: Secondary | ICD-10-CM

## 2012-05-27 ENCOUNTER — Encounter (HOSPITAL_COMMUNITY)
Admission: RE | Admit: 2012-05-27 | Discharge: 2012-05-27 | Disposition: A | Discharge status: Home / Self Care | Payer: Managed Care, Other (non HMO) | Source: Ambulatory Visit | Attending: Internal Medicine | Admitting: Internal Medicine

## 2012-05-27 MED ORDER — SODIUM IODIDE I 131 CAPSULE
10.4000 | Freq: Once | INTRAVENOUS | Status: AC | PRN
  Administered 2012-05-27: 10.4 via ORAL

## 2012-05-27 MED ORDER — SODIUM PERTECHNETATE TC 99M INJECTION
11.0000 | Freq: Once | INTRAVENOUS | Status: AC | PRN
  Administered 2012-05-27: 11 via INTRAVENOUS

## 2012-06-05 ENCOUNTER — Encounter (HOSPITAL_COMMUNITY): Payer: Self-pay

## 2012-06-05 ENCOUNTER — Encounter (HOSPITAL_COMMUNITY)
Admission: RE | Admit: 2012-06-05 | Discharge: 2012-06-05 | Disposition: A | Discharge status: Home / Self Care | Payer: Managed Care, Other (non HMO) | Source: Ambulatory Visit | Attending: Internal Medicine | Admitting: Internal Medicine

## 2012-06-05 DIAGNOSIS — E0500 Thyrotoxicosis with diffuse goiter without thyrotoxic crisis or storm: Secondary | ICD-10-CM | POA: Insufficient documentation

## 2012-06-05 MED ORDER — SODIUM IODIDE I 131 CAPSULE
14.0000 | Freq: Once | INTRAVENOUS | Status: AC | PRN
  Administered 2012-06-05: 14 via ORAL

## 2014-02-26 ENCOUNTER — Emergency Department (HOSPITAL_BASED_OUTPATIENT_CLINIC_OR_DEPARTMENT_OTHER)
Admission: EM | Admit: 2014-02-26 | Discharge: 2014-02-26 | Disposition: A | Discharge status: Home / Self Care | Payer: Managed Care, Other (non HMO) | Attending: Emergency Medicine | Admitting: Emergency Medicine

## 2014-02-26 ENCOUNTER — Encounter (HOSPITAL_BASED_OUTPATIENT_CLINIC_OR_DEPARTMENT_OTHER): Payer: Self-pay | Admitting: Emergency Medicine

## 2014-02-26 ENCOUNTER — Emergency Department (HOSPITAL_BASED_OUTPATIENT_CLINIC_OR_DEPARTMENT_OTHER): Payer: Managed Care, Other (non HMO)

## 2014-02-26 DIAGNOSIS — R079 Chest pain, unspecified: Secondary | ICD-10-CM | POA: Diagnosis present

## 2014-02-26 DIAGNOSIS — Z91040 Latex allergy status: Secondary | ICD-10-CM | POA: Insufficient documentation

## 2014-02-26 DIAGNOSIS — Z87891 Personal history of nicotine dependence: Secondary | ICD-10-CM | POA: Insufficient documentation

## 2014-02-26 DIAGNOSIS — I10 Essential (primary) hypertension: Secondary | ICD-10-CM | POA: Diagnosis not present

## 2014-02-26 DIAGNOSIS — Z8639 Personal history of other endocrine, nutritional and metabolic disease: Secondary | ICD-10-CM | POA: Insufficient documentation

## 2014-02-26 DIAGNOSIS — Z79899 Other long term (current) drug therapy: Secondary | ICD-10-CM | POA: Insufficient documentation

## 2014-02-26 DIAGNOSIS — Z7982 Long term (current) use of aspirin: Secondary | ICD-10-CM | POA: Insufficient documentation

## 2014-02-26 DIAGNOSIS — Z862 Personal history of diseases of the blood and blood-forming organs and certain disorders involving the immune mechanism: Secondary | ICD-10-CM | POA: Insufficient documentation

## 2014-02-26 DIAGNOSIS — R0789 Other chest pain: Secondary | ICD-10-CM

## 2014-02-26 DIAGNOSIS — I252 Old myocardial infarction: Secondary | ICD-10-CM | POA: Insufficient documentation

## 2014-02-26 DIAGNOSIS — R0602 Shortness of breath: Secondary | ICD-10-CM | POA: Diagnosis not present

## 2014-02-26 LAB — CBC
HEMATOCRIT: 36.3 % (ref 36.0–46.0)
Hemoglobin: 12.5 g/dL (ref 12.0–15.0)
MCH: 29.3 pg (ref 26.0–34.0)
MCHC: 34.4 g/dL (ref 30.0–36.0)
MCV: 85 fL (ref 78.0–100.0)
Platelets: 215 10*3/uL (ref 150–400)
RBC: 4.27 MIL/uL (ref 3.87–5.11)
RDW: 13.1 % (ref 11.5–15.5)
WBC: 9.4 10*3/uL (ref 4.0–10.5)

## 2014-02-26 LAB — D-DIMER, QUANTITATIVE: D-Dimer, Quant: 2.91 ug/mL-FEU — ABNORMAL HIGH (ref 0.00–0.48)

## 2014-02-26 LAB — COMPREHENSIVE METABOLIC PANEL
ALBUMIN: 4.3 g/dL (ref 3.5–5.2)
ALK PHOS: 108 U/L (ref 39–117)
ALT: 16 U/L (ref 0–35)
ANION GAP: 15 (ref 5–15)
AST: 25 U/L (ref 0–37)
BILIRUBIN TOTAL: 0.6 mg/dL (ref 0.3–1.2)
BUN: 24 mg/dL — AB (ref 6–23)
CHLORIDE: 98 meq/L (ref 96–112)
CO2: 27 mEq/L (ref 19–32)
Calcium: 10.9 mg/dL — ABNORMAL HIGH (ref 8.4–10.5)
Creatinine, Ser: 0.9 mg/dL (ref 0.50–1.10)
GFR calc Af Amer: 84 mL/min — ABNORMAL LOW (ref >90)
GFR calc non Af Amer: 73 mL/min — ABNORMAL LOW (ref >90)
Glucose, Bld: 106 mg/dL — ABNORMAL HIGH (ref 70–99)
POTASSIUM: 3.8 meq/L (ref 3.7–5.3)
Sodium: 140 mEq/L (ref 137–147)
Total Protein: 8.6 g/dL — ABNORMAL HIGH (ref 6.0–8.3)

## 2014-02-26 LAB — TROPONIN I: Troponin I: <0.3 ng/mL (ref <0.30)

## 2014-02-26 MED ORDER — IOHEXOL 350 MG/ML SOLN
100.0000 mL | Freq: Once | INTRAVENOUS | Status: AC | PRN
  Administered 2014-02-26: 100 mL via INTRAVENOUS

## 2014-02-26 MED ORDER — HYDROCODONE-ACETAMINOPHEN 5-325 MG PO TABS: 1.0000 | ORAL_TABLET | ORAL | Status: AC | PRN

## 2014-02-26 NOTE — Discharge Instructions (Signed)

## 2014-02-26 NOTE — ED Notes (Signed)
Reports CP since last Saturday. Sts travelled to [place] and back last week. Pt sts [facility].

## 2014-02-26 NOTE — ED Provider Notes (Signed)
CSN: [id number]     Arrival date & time 02/26/14  5284 History  This chart was scribed for non-physician practitioner Elpidio Anis working with Hurman Horn, MD by Carl Best, ED Scribe. This patient was seen in room MH05/MH05 and the patient's care was started at 6:45 PM.      Chief Complaint  Patient presents with  . Chest Pain    Patient is a 53 y.o. female presenting with chest pain. The history is provided by the patient. No language interpreter was used.  Chest Pain Associated symptoms: shortness of breath   Associated symptoms: no abdominal pain, no fever, no nausea and not vomiting    HPI Comments: [name] is a 53 y.o. female who presents to the Emergency Department complaining of recurring, stabbing chest pain located under her right breast and extending to her left shoulder blade that started 6 days ago.  She states that she drove to and from [place] last weekend but would intermittently stop for a break.  She started experiencing her symptoms and hot flashes when she arrived in [place] which persisted throughout the entire weekend.  She experienced some SOB on Monday which has since resolved.  She experienced some relief to her pain on Tuesday however, her pain returned yesterday.  Taking a deep breath aggravates the pain.  She denies fever, nausea, vomiting, abdominal pain and cough as associated symptoms.  She lists chills and mild right leg pain as associated symptoms.   She took [place] thinking she was experiencing GERD symptoms but did not experience any relief to her chest pain.  She does not have a history of blood clots.  She has a history of Grave's disease for which she takes Synthroid.  She takes medication for hypertension.  She started taking aspirin daily after she had an MI on June 29, 2008.  Her symptoms are similar to the symptoms she experienced when she had an MI.  She does not have any stents and did not have a catheterization.  She last saw her  cardiologist two years ago and was told to follow-up when needed.  She does not have a history of smoking but smoked one cigarette last Saturday.    Past Medical History  Diagnosis Date  . Grave's disease   . Midsternal chest pain     a. 06/2009 elevated trop in setting of tachycardia/hyperthyroidism;  b. 06/2009 nonischemic myoview;  c. recurrent c/p cath scheduled and pt cancelled.  Marland Kitchen HTN (hypertension)   . Palpitations     a. 48 hr holter 08/2009 - RSR/SB   Past Surgical History  Procedure Laterality Date  . Abdominal hysterectomy    . Tonsillectomy  1990    `   No family history on file. History  Substance Use Topics  . Smoking status: Former Games developer  . Smokeless tobacco: Not on file  . Alcohol Use: No   OB History   Grav Para Term Preterm Abortions TAB SAB Ect Mult Living                 Review of Systems  Constitutional: Negative for fever.  Respiratory: Positive for shortness of breath.   Cardiovascular: Positive for chest pain. Negative for leg swelling.  Gastrointestinal: Negative for nausea, vomiting and abdominal pain.  All other systems reviewed and are negative.     Allergies  Latex  Home Medications   Prior to Admission medications   Medication Sig Start Date End Date Taking? Authorizing Provider  aspirin 325  MG tablet Take 325 mg by mouth daily.    Historical Provider, MD  clonazePAM (KLONOPIN) 0.5 MG tablet Take 0.5 mg by mouth 2 (two) times daily as needed.    Historical Provider, MD  DUEXIS 800-26.6 MG TABS 3 (three) times daily. 800-26.6 12/04/11   Historical Provider, MD  methimazole (TAPAZOLE) 10 MG tablet Take 10 mg by mouth 3 (three) times daily.    Historical Provider, MD  metoprolol (LOPRESSOR) 50 MG tablet Take 50 mg by mouth 2 (two) times daily.    Historical Provider, MD  nitroGLYCERIN (NITROSTAT) 0.4 MG SL tablet Place 0.4 mg under the tongue every 5 (five) minutes as needed.    Historical Provider, MD  potassium chloride (K-DUR) 10 MEQ  tablet Take 20 mEq by mouth daily.     Historical Provider, MD   BP 138/93  Pulse 87  Temp(Src) 98.4 F (36.9 C) (Oral)  Resp 22  Ht  (1.651 m)  Wt 218 lb (98.884 kg)  BMI 36.28 kg/m2  SpO2 97%  Physical Exam  Nursing note and vitals reviewed. Constitutional: She is oriented to person, place, and time. She appears well-developed and well-nourished.  HENT:  Head: Normocephalic and atraumatic.  Eyes: EOM are normal.  Neck: Normal range of motion.  Cardiovascular: Normal rate and regular rhythm.   No murmur heard. Pulmonary/Chest: Effort normal and breath sounds normal. No respiratory distress. She has no wheezes. She has no rales.  Minimally reproducible left sided chest wall tenderness.   Abdominal: There is no tenderness.  Musculoskeletal: Normal range of motion. She exhibits no edema and no tenderness.  Neurological: She is alert and oriented to person, place, and time.  Skin: Skin is warm and dry.  Psychiatric: She has a normal mood and affect. Her behavior is normal.    ED Course  Procedures (including critical care time) Labs Review Labs Reviewed  CBC  COMPREHENSIVE METABOLIC PANEL  TROPONIN I  D-DIMER, QUANTITATIVE   Results for orders placed during the hospital encounter of 02/26/14  CBC      Result Value Ref Range   WBC 9.4  4.0 - 10.5 K/uL   RBC 4.27  3.87 - 5.11 MIL/uL   Hemoglobin 12.5  12.0 - 15.0 g/dL   HCT 16.1  09.6 - 04.5 %   MCV 85.0  78.0 - 100.0 fL   MCH 29.3  26.0 - 34.0 pg   MCHC 34.4  30.0 - 36.0 g/dL   RDW 40.9  81.1 - 91.4 %   Platelets 215  150 - 400 K/uL  COMPREHENSIVE METABOLIC PANEL      Result Value Ref Range   Sodium 140  137 - 147 mEq/L   Potassium 3.8  3.7 - 5.3 mEq/L   Chloride 98  96 - 112 mEq/L   CO2 27  19 - 32 mEq/L   Glucose, Bld 106 (*) 70 - 99 mg/dL   BUN 24 (*) 6 - 23 mg/dL   Creatinine, Ser 7.82  0.50 - 1.10 mg/dL   Calcium 95.6 (*) 8.4 - 10.5 mg/dL   Total Protein 8.6 (*) 6.0 - 8.3 g/dL   Albumin 4.3  3.5 -  5.2 g/dL   AST 25  0 - 37 U/L   ALT 16  0 - 35 U/L   Alkaline Phosphatase 108  39 - 117 U/L   Total Bilirubin 0.6  0.3 - 1.2 mg/dL   GFR calc non Af Amer 73 (*) >90 mL/min   GFR calc Af  Amer 84 (*) >90 mL/min   Anion gap 15  5 - 15  TROPONIN I      Result Value Ref Range   Troponin I <0.30  <0.30 ng/mL  D-DIMER, QUANTITATIVE      Result Value Ref Range   D-Dimer, Quant 2.91 (*) 0.00 - 0.48 ug/mL-FEU   Dg Chest 2 View  02/26/2014   CLINICAL DATA:  Chest pain  EXAM: CHEST  2 VIEW  COMPARISON:  06/29/2009  FINDINGS: Lungs are clear.  No pleural effusion or pneumothorax.  The heart is normal in size.  Visualized osseous structures are within normal limits.  IMPRESSION: No evidence of acute cardiopulmonary disease.   Electronically Signed   By: Charline Bills M.D.   On: 02/26/2014 19:40   Ct Angio Chest W/cm &/or Wo Cm  02/26/2014   CLINICAL DATA:  Chest pain.  Elevated D-dimer.  EXAM: CT ANGIOGRAPHY CHEST WITH CONTRAST  TECHNIQUE: Multidetector CT imaging of the chest was performed using the standard protocol during bolus administration of intravenous contrast. Multiplanar CT image reconstructions and MIPs were obtained to evaluate the vascular anatomy.  CONTRAST:  OMNIPAQUE IOHEXOL 350 MG/ML SOLN  COMPARISON:  Chest x-ray earlier today.  FINDINGS: No evidence of acute pulmonary embolism. The thoracic aorta is also well opacified and normal. No edema or pleural effusion.  Focal subpleural rounded opacity measuring 1.2- 1.3 cm in the posterior lower left hemi thorax could represent focal atelectasis, infiltrate or nodule. Followup is recommended.  No enlarged lymph nodes are seen. The heart size is normal. There is calcified plaque in the distribution of the LAD. No pericardial fluid. Visualized airways are normally patent.  Bony structures are unremarkable.  Review of the MIP images confirms the above findings.  IMPRESSION: 1. No evidence of pulmonary embolism. 2. Focal rounded subpleural  opacity in the left posterior hemi thorax measuring roughly 1.2 cm. This could represent focal rounded atelectasis, infiltrate or nodule. Followup CT of the chest is recommended. 3. Coronary artery disease with calcified plaque in the distribution of the LAD.   Electronically Signed   By: Irish Lack M.D.   On: 02/26/2014 20:18   Imaging Review No results found.   EKG Interpretation   Date/Time:  Friday February 26 2014 18:29:09 EDT Ventricular Rate:  88 PR Interval:  152 QRS Duration: 86 QT Interval:  362 QTC Calculation: 438 R Axis:   63 Text Interpretation:  Normal sinus rhythm Cannot rule out Anterior infarct  , age undetermined Compared to previous tracing Left ventricular  hypertrophy NO LONGER PRESENT Confirmed by Fonnie Jarvis  MD, Jonny Ruiz ([id number]) on  02/26/2014 6:31:41 PM      MDM   Final diagnoses:  None    1. Chest pain  Patient having atypical chest pain for multiple days, neg EKG, neg troponin. She had a positive D-dimer but negative CT angio. VSS. Doubt ACS, PE ruled out, no PNA. She can be discharged home to follow up with her primary care MD for recheck.   I personally performed the services described in this documentation, which was scribed in my presence. The recorded information has been reviewed and is accurate.     Arnoldo Hooker, PA-C 02/26/14 2037

## 2014-02-26 NOTE — ED Notes (Signed)
Patient transported to CT 

## 2014-02-26 NOTE — ED Notes (Signed)
Patient transported to X-ray 

## 2014-02-27 NOTE — ED Provider Notes (Signed)
Medical screening examination/treatment/procedure(s) were performed by non-physician practitioner and as supervising physician I was immediately available for consultation/collaboration.   EKG Interpretation   Date/Time:  Friday February 26 2014 18:29:09 EDT Ventricular Rate:  88 PR Interval:  152 QRS Duration: 86 QT Interval:  362 QTC Calculation: 438 R Axis:   63 Text Interpretation:  Normal sinus rhythm Cannot rule out Anterior infarct  , age undetermined Compared to previous tracing Left ventricular  hypertrophy NO LONGER PRESENT Confirmed by Fonnie Jarvis  MD, Jonny Ruiz ([id number]) on  02/26/2014 6:31:41 PM       Hurman Horn, MD 02/27/14 [phone]

## 2016-07-31 ENCOUNTER — Encounter: Payer: Self-pay | Admitting: Physical Therapy

## 2016-07-31 ENCOUNTER — Ambulatory Visit: Payer: Managed Care, Other (non HMO) | Attending: Family Medicine | Admitting: Physical Therapy

## 2016-07-31 DIAGNOSIS — R2689 Other abnormalities of gait and mobility: Secondary | ICD-10-CM | POA: Insufficient documentation

## 2016-07-31 DIAGNOSIS — M25552 Pain in left hip: Secondary | ICD-10-CM | POA: Diagnosis present

## 2016-07-31 DIAGNOSIS — M25652 Stiffness of left hip, not elsewhere classified: Secondary | ICD-10-CM | POA: Insufficient documentation

## 2016-07-31 DIAGNOSIS — M6281 Muscle weakness (generalized): Secondary | ICD-10-CM | POA: Insufficient documentation

## 2016-07-31 NOTE — Patient Instructions (Signed)
Knee Roll    Lying on back, with knees bent and feet flat on floor, arms outstretched to sides, slowly roll both knees to side, hold 5 seconds. Back to starting position, hold 5 seconds. Then to opposite side, hold 5 seconds. Return to starting position. Keep shoulders and arms in contact with floor.   Copyright  VHI. All rights reserved.  Piriformis Stretch, Sitting PNF    Sit, one ankle on opposite knee, same-side hand on crossed knee. Push down on knee, keeping spine straight. Lean torso forward until tension is felt in hamstrings and gluteals of crossed-leg side. Contract hip muscles so knee rotates further toward floor, resisting with hand. Hold _10__ seconds. Release tension and bring torso closer to calf. Hold _10__ seconds.  Repeat _2__ times per session.   Copyright  VHI. All rights reserved.  BACK: Hip Flexor Stretch    Interlace fingers on top of right knee. Shift weight forward. Continue breathing normally and hold position for _5__ breaths. Repeat on other leg. Alternate sides _2__ times.   Copyright  VHI. All rights reserved.    Hamstring Step 1    Straighten left knee. Keep knee level with other knee or on bolster. Hold _10__ seconds. Relax knee by returning foot to start. Repeat __2_ times.  Copyright  VHI. All rights reserved.   [name], PTA 07/31/16 9:45 AM  [facility] [address], [address] [place], [place] [address] Phone # [phone] Fax [phone]

## 2016-07-31 NOTE — Therapy (Addendum)
[facility] [address] [place], [place], [address] Phone: [phone]   Fax:  [phone]  Physical Therapy Evaluation  Patient Details  Name: [name] MRN: [id number] Date of Birth: 11/21/1960 Referring Provider: Dr. Kathyrn Lass  Encounter Date: 07/31/2016      PT End of Session - 07/31/16 1117    Visit Number 1   Date for PT Re-Evaluation 09/25/16   PT Start Time 0845   PT Stop Time 0930   PT Time Calculation (min) 45 min   Activity Tolerance Patient tolerated treatment well   Behavior During Therapy [facility] for tasks assessed/performed      Past Medical History:  Diagnosis Date  . Grave's disease   . HTN (hypertension)   . Midsternal chest pain    a. 06/2009 elevated trop in setting of tachycardia/hyperthyroidism;  b. 06/2009 nonischemic myoview;  c. recurrent c/p cath scheduled and pt cancelled.  . Palpitations    a. 48 hr holter 08/2009 - RSR/SB    Past Surgical History:  Procedure Laterality Date  . ABDOMINAL HYSTERECTOMY    . TONSILLECTOMY  1990   `    There were no vitals filed for this visit.       Subjective Assessment - 07/31/16 0855    Subjective Patient reports 06/2015 felt a burning sensation on left thigh and buttocks.  At first MD said it was bursitis.  Patient did a home exercise program from doctor but did not help.    Currently in Pain? Yes   Pain Score 8    Pain Location Leg   Pain Orientation Left   Pain Descriptors / Indicators Burning;Aching;Numbness;Sore  knotty   Pain Type Chronic pain   Pain Radiating Towards down left leg   Pain Onset More than a month ago   Pain Frequency Constant   Aggravating Factors  getting in and out of tub; after sitting 3-4 hours then get up feeling numbness in left leg;    Pain Relieving Factors hot pad; pain cream   Multiple Pain Sites No            [facility] PT Assessment - 07/31/16 0001      Assessment   Medical Diagnosis Pain of  left lower extremity    Referring Provider Dr. Kathyrn Lass   Onset Date/Surgical Date 06/26/15   Prior Therapy None     Precautions   Precautions None     Restrictions   Weight Bearing Restrictions No     Balance Screen   Has the patient fallen in the past 6 months No   Has the patient had a decrease in activity level because of a fear of falling?  No   Is the patient reluctant to leave their home because of a fear of falling?  No     Home Ecologist residence     Prior Function   Level of Independence Independent   Vocation Full time employment   Vocation Requirements sitting 99% of the time   Leisure walking     Cognition   Overall Cognitive Status Within Functional Limits for tasks assessed     Observation/Other Assessments   Focus on Therapeutic Outcomes (FOTO)  77% limitation  51% limitation     Posture/Postural Control   Posture/Postural Control No significant limitations     ROM / Strength   AROM / PROM / Strength AROM;PROM;Strength     AROM   Lumbar Flexion full with  tightness in lumbar area   Lumbar Extension full with movement at L3-L4   Lumbar - Left Side Bend full with decreased "c" curve and movement at L4   Lumbar - Right Rotation movement at L4     PROM   Left Hip Extension 4  AROM 0 degrees   Left Hip External Rotation  45     Strength   Overall Strength Comments abdominal strength 2/5   Right Hip ADduction 3/5   Left Hip External Rotation 3+/5   Left Hip ABduction 3/5   Left Hip ADduction 4/5     Palpation   Spinal mobility L1-L3 rotated left   SI assessment  left ilium is posteriorly rotated, right sacral deeper; left ishcial tuberosity is tighter   Palpation comment tenderness located in left psoas; pinpoint tenderness on left greater trochanter     Special Tests    Special Tests Hip Special Tests  supine left higher; long sit left longer   Hip Special Tests  Trendelenberg Test     Trendelenburg Test    Findings Positive   Side Left;Right   Comments hip drop     Transfers   Transfers Not assessed     Ambulation/Gait   Ambulation/Gait Yes   Gait Pattern --  decreased hip extension on the left                           PT Education - 07/31/16 0946    Education provided Yes   Education Details Stretches   Person(s) Educated Patient   Methods Explanation;Demonstration;Handout   Comprehension Verbalized understanding          PT Short Term Goals - 07/31/16 1131      PT SHORT TERM GOAL #1   Title independent with initial HEP   Time 4   Period Weeks   Status New     PT SHORT TERM GOAL #2   Title ability to sit then stand with pain decreased >/= 25%   Time 4   Period Weeks   Status New     PT SHORT TERM GOAL #3   Title ability to take a shower due to improved trust in left leg and increased strength of left leg   Time 4   Period Weeks   Status New     PT SHORT TERM GOAL #4   Title understand how to perform abdominal massage to reduce constipation    Time 4   Period Weeks   Status New           PT Long Term Goals - 07/31/16 1132      PT LONG TERM GOAL #1   Title independent with HEP    Time 8   Period Weeks   Status New     PT LONG TERM GOAL #2   Title getting in and out of tub with pain decreased >/= 75% due to increased hip mobility   Time 8   Period Weeks   Status New     PT LONG TERM GOAL #3   Title ability to take a shower with her left leg not giving way and has strength >/= 4+/5.    Time 8   Period Weeks   Status New     PT LONG TERM GOAL #4   Title sit for 3 hours and get up with pain decreased </= 75%   Time 8   Period Weeks   Status New  PT LONG TERM GOAL #5   Title FOTO score </= 51% limitation   Time 8   Period Weeks   Status New               Plan - 07/31/16 1120    Clinical Impression Statement Patient is a 56 year old female with left hip pain for the past year with sudden onset.  Patient  reports pain is constant at level 8/10 with sitting and getting out of the tub. Patient has weakness in left hip and abdominals.  Left ilium is rotated posteriorly and sacrum is rotated right.  L1-L4 rotated left.  Patient lumbar movement is mostly at L4 level.  Patient has palpable tenderness located in left lateral hips.  Decreased left hip extension and external rotation ROM.  Decreased mobility of left hip capsule. Patient is low complex evaluation due to stable condtion and no comorbidities that will impact care provided. Patient will benefit from skilled therapy to reduce left hip pain and mobility to improve function.    Rehab Potential Excellent   Clinical Impairments Affecting Rehab Potential none   PT Frequency 2x / week   PT Duration 8 weeks   PT Treatment/Interventions Cryotherapy;Electrical Stimulation;Iontophoresis 58m/ml Dexamethasone;Gait training;Ultrasound;Moist Heat;Therapeutic activities;Therapeutic exercise;Neuromuscular re-education;Patient/family education;Passive range of motion;Manual techniques;Dry needling   PT Next Visit Plan left hip mobilization; correct left ilium; lumbar spine mobilization; soft tissue work; iontophoresis to left hip when MD signs note; left hip strength   PT Home Exercise Plan progress as needed   Recommended Other Services None   Consulted and Agree with Plan of Care Patient      Patient will benefit from skilled therapeutic intervention in order to improve the following deficits and impairments:  Pain, Decreased range of motion, Difficulty walking, Increased fascial restricitons, Increased muscle spasms, Decreased activity tolerance, Decreased mobility, Decreased strength  Visit Diagnosis: Muscle weakness (generalized) - Plan: PT plan of care cert/re-cert  Pain in left hip - Plan: PT plan of care cert/re-cert  Stiffness of left hip, not elsewhere classified - Plan: PT plan of care cert/re-cert  Other abnormalities of gait and mobility - Plan:  PT plan of care cert/re-cert     Problem List Patient Active Problem List   Diagnosis Date Noted  . Graves' disease 05/02/2012  . Lower extremity edema 12/25/2011  . Right leg pain 12/25/2011  . PALPITATIONS 08/23/2009  . CHEST PAIN 08/23/2009  . HYPERTHYROIDISM, NOS 07/12/2009  . ESSENTIAL HYPERTENSION 07/12/2009  . MYOCARDIAL INFARCTION, HX OF 07/12/2009    CEarlie Counts PT 07/31/16 11:38 AM PHYSICAL THERAPY DISCHARGE SUMMARY  Visits from Start of Care: 1  Current functional level related to goals / functional outcomes: Pt didn't return to PT after evaluation.     Remaining deficits: See above for current status.     Education / Equipment: HEP Plan: Patient agrees to discharge.  Patient goals were not met. Patient is being discharged due to not returning since the last visit.  ?????         KSigurd Sos PT 10/10/16 11:37 AM  [facility] [address] [place] [address]Phone: [phone]  Fax:  [phone] Name: PArelyn GauerMRN: [id number] of Birth: 3December 28, 1962

## 2016-08-09 ENCOUNTER — Telehealth: Payer: Self-pay

## 2016-08-09 ENCOUNTER — Ambulatory Visit: Payer: Managed Care, Other (non HMO)

## 2016-08-09 NOTE — Telephone Encounter (Signed)
PT tried to call pt due to missed appointment at 7:30 today.  Voicemail was full, not able to leave a message.

## 2016-08-23 ENCOUNTER — Ambulatory Visit: Payer: Managed Care, Other (non HMO) | Attending: Family Medicine

## 2016-08-23 ENCOUNTER — Telehealth: Payer: Self-pay

## 2016-08-23 NOTE — Telephone Encounter (Signed)
No-show for appointment today at 7:30.  Called pt, unable to leave a message as mailbox is full.

## 2017-06-04 ENCOUNTER — Encounter (HOSPITAL_BASED_OUTPATIENT_CLINIC_OR_DEPARTMENT_OTHER): Payer: Self-pay | Admitting: Emergency Medicine

## 2017-06-04 ENCOUNTER — Other Ambulatory Visit: Payer: Self-pay

## 2017-06-04 ENCOUNTER — Emergency Department (HOSPITAL_BASED_OUTPATIENT_CLINIC_OR_DEPARTMENT_OTHER)
Admission: EM | Admit: 2017-06-04 | Discharge: 2017-06-04 | Disposition: A | Discharge status: Home / Self Care | Payer: 59 | Attending: Emergency Medicine | Admitting: Emergency Medicine

## 2017-06-04 DIAGNOSIS — Z041 Encounter for examination and observation following transport accident: Secondary | ICD-10-CM | POA: Insufficient documentation

## 2017-06-04 DIAGNOSIS — Z7982 Long term (current) use of aspirin: Secondary | ICD-10-CM | POA: Diagnosis not present

## 2017-06-04 DIAGNOSIS — M2560 Stiffness of unspecified joint, not elsewhere classified: Secondary | ICD-10-CM | POA: Diagnosis not present

## 2017-06-04 DIAGNOSIS — Z79899 Other long term (current) drug therapy: Secondary | ICD-10-CM | POA: Diagnosis not present

## 2017-06-04 DIAGNOSIS — Z87891 Personal history of nicotine dependence: Secondary | ICD-10-CM | POA: Diagnosis not present

## 2017-06-04 DIAGNOSIS — I10 Essential (primary) hypertension: Secondary | ICD-10-CM | POA: Diagnosis not present

## 2017-06-04 DIAGNOSIS — R51 Headache: Secondary | ICD-10-CM | POA: Insufficient documentation

## 2017-06-04 MED ORDER — CYCLOBENZAPRINE HCL 10 MG PO TABS: 10.0000 mg | ORAL_TABLET | Freq: Three times a day (TID) | ORAL | 0 refills | Status: AC | PRN

## 2017-06-04 MED ORDER — IBUPROFEN 800 MG PO TABS
800.0000 mg | ORAL_TABLET | Freq: Once | ORAL | Status: AC
  Administered 2017-06-04: 800 mg via ORAL
  Filled 2017-06-04: qty 1

## 2017-06-04 MED FILL — CYCLOBENZAPRINE 10 MG TABLE: 10 | 5 days supply | Qty: 15 | Fill #0
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
## 2017-06-04 NOTE — Discharge Instructions (Signed)
You may alternate Tylenol 1000 mg every 6 hours as needed for pain and Ibuprofen 800 mg every 8 hours as needed for pain.  Please take Ibuprofen with food. ° °
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
## 2019-09-28 ENCOUNTER — Other Ambulatory Visit: Payer: Self-pay | Admitting: Obstetrics and Gynecology

## 2019-09-28 DIAGNOSIS — Z9189 Other specified personal risk factors, not elsewhere classified: Secondary | ICD-10-CM

## 2021-11-21 ENCOUNTER — Other Ambulatory Visit: Payer: Self-pay | Admitting: Obstetrics and Gynecology

## 2021-11-21 DIAGNOSIS — R928 Other abnormal and inconclusive findings on diagnostic imaging of breast: Secondary | ICD-10-CM

## 2021-11-23 ENCOUNTER — Ambulatory Visit
Admission: RE | Admit: 2021-11-23 | Discharge: 2021-11-23 | Disposition: A | Discharge status: Home / Self Care | Payer: No Typology Code available for payment source | Source: Ambulatory Visit | Attending: Obstetrics and Gynecology | Admitting: Obstetrics and Gynecology

## 2021-11-23 ENCOUNTER — Ambulatory Visit
Admission: RE | Admit: 2021-11-23 | Discharge: 2021-11-23 | Disposition: A | Discharge status: Home / Self Care | Payer: 59 | Source: Ambulatory Visit | Attending: Obstetrics and Gynecology | Admitting: Obstetrics and Gynecology

## 2021-11-23 ENCOUNTER — Other Ambulatory Visit: Payer: Self-pay | Admitting: Obstetrics and Gynecology

## 2021-11-23 DIAGNOSIS — R928 Other abnormal and inconclusive findings on diagnostic imaging of breast: Secondary | ICD-10-CM

## 2021-11-23 DIAGNOSIS — N6489 Other specified disorders of breast: Secondary | ICD-10-CM

## 2022-05-28 ENCOUNTER — Other Ambulatory Visit: Payer: Self-pay | Admitting: Obstetrics and Gynecology

## 2022-05-28 ENCOUNTER — Ambulatory Visit
Admission: RE | Admit: 2022-05-28 | Discharge: 2022-05-28 | Disposition: A | Discharge status: Home / Self Care | Payer: No Typology Code available for payment source | Source: Ambulatory Visit | Attending: Obstetrics and Gynecology | Admitting: Obstetrics and Gynecology

## 2022-05-28 ENCOUNTER — Ambulatory Visit: Admission: RE | Admit: 2022-05-28 | Payer: No Typology Code available for payment source | Source: Ambulatory Visit

## 2022-05-28 ENCOUNTER — Encounter: Payer: Self-pay | Admitting: Obstetrics and Gynecology

## 2022-05-28 DIAGNOSIS — N6489 Other specified disorders of breast: Secondary | ICD-10-CM

## 2022-12-06 ENCOUNTER — Ambulatory Visit
Admission: RE | Admit: 2022-12-06 | Discharge: 2022-12-06 | Disposition: A | Discharge status: Home / Self Care | Payer: No Typology Code available for payment source | Source: Ambulatory Visit | Attending: Obstetrics and Gynecology | Admitting: Obstetrics and Gynecology

## 2022-12-06 DIAGNOSIS — N6489 Other specified disorders of breast: Secondary | ICD-10-CM

## 2023-10-14 IMAGING — MG MM DIGITAL DIAGNOSTIC UNILAT*L* W/ TOMO W/ CAD
4 series · 4 of 12 positions shown · non-contrast
Comparison: Previous exam(s).

CLINICAL DATA: Screening recall for possible left breast mass.

EXAM:
DIGITAL DIAGNOSTIC UNILATERAL LEFT MAMMOGRAM WITH TOMOSYNTHESIS AND
CAD; ULTRASOUND LEFT BREAST LIMITED
TECHNIQUE: Left digital diagnostic mammography and breast tomosynthesis was
performed. The images were evaluated with computer-aided detection.;
Targeted ultrasound examination of the left breast was performed.

[L MLO synth-2D]
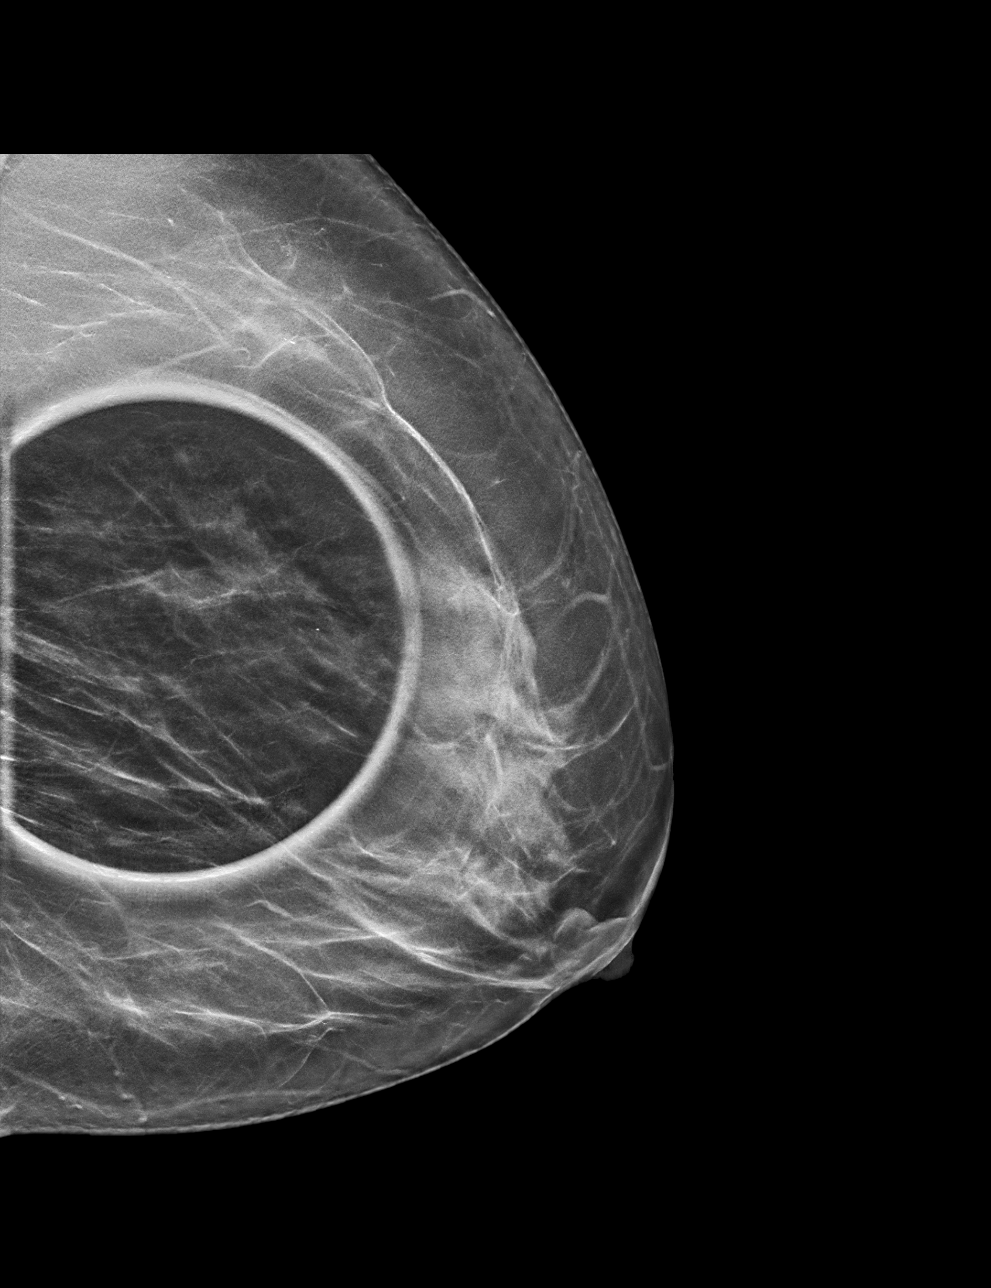

[L CC synth-2D]
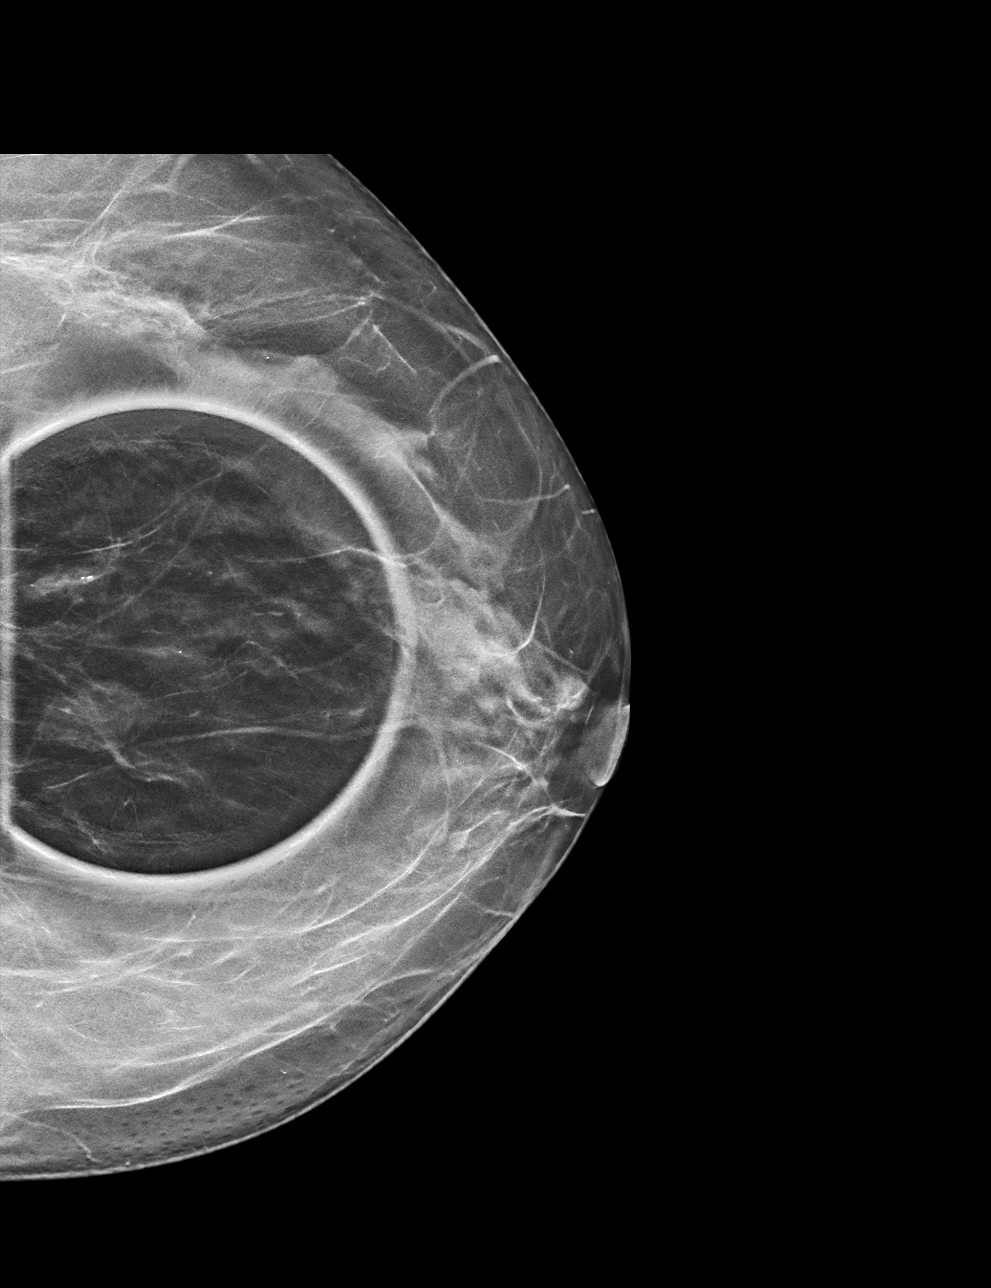

[L MLO tomo · tomo slice 29/57.0]
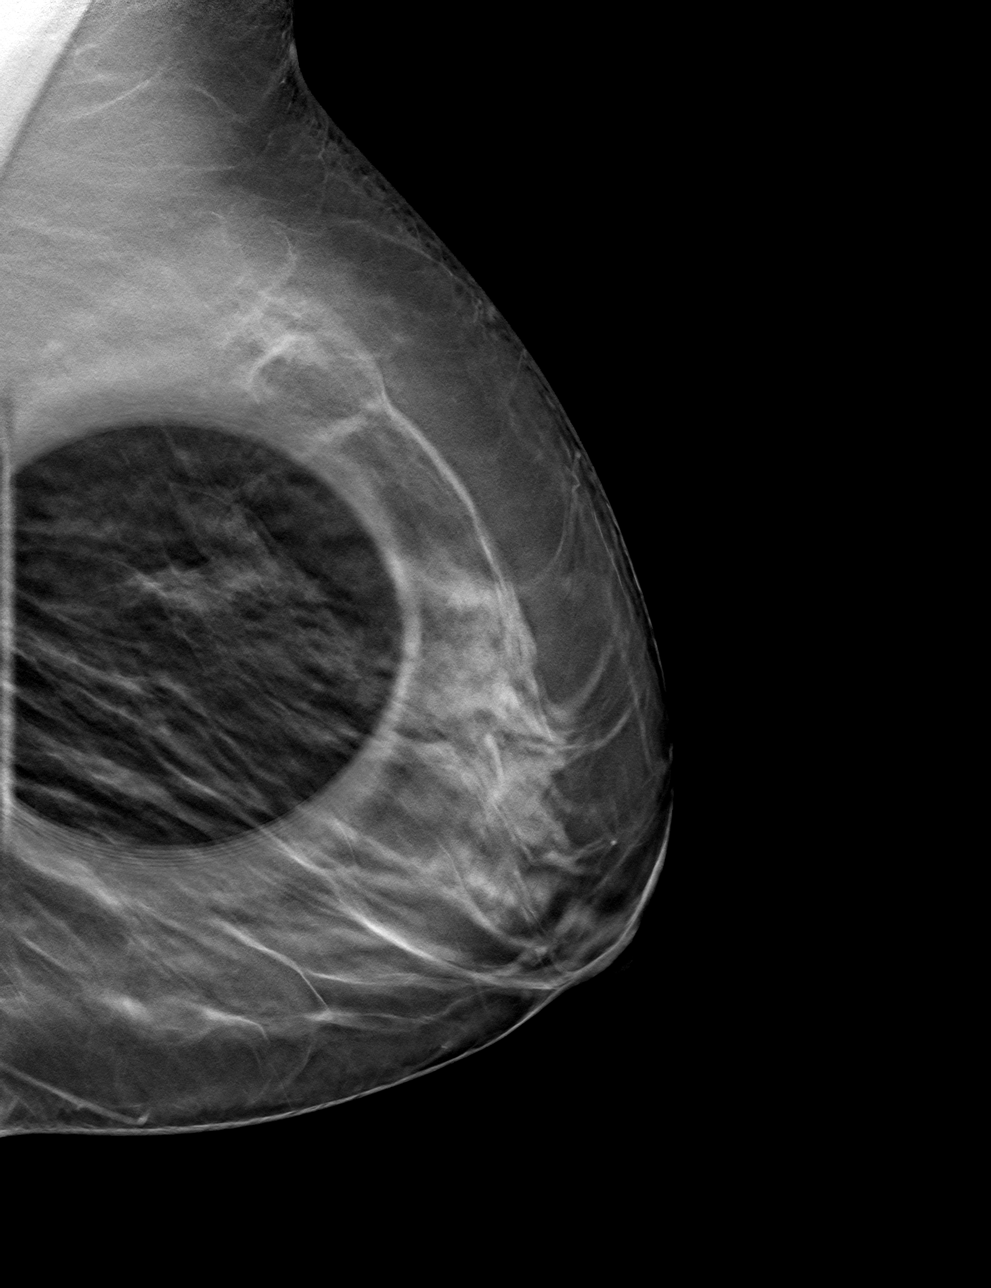

[L CC tomo · tomo slice 25/49.0]
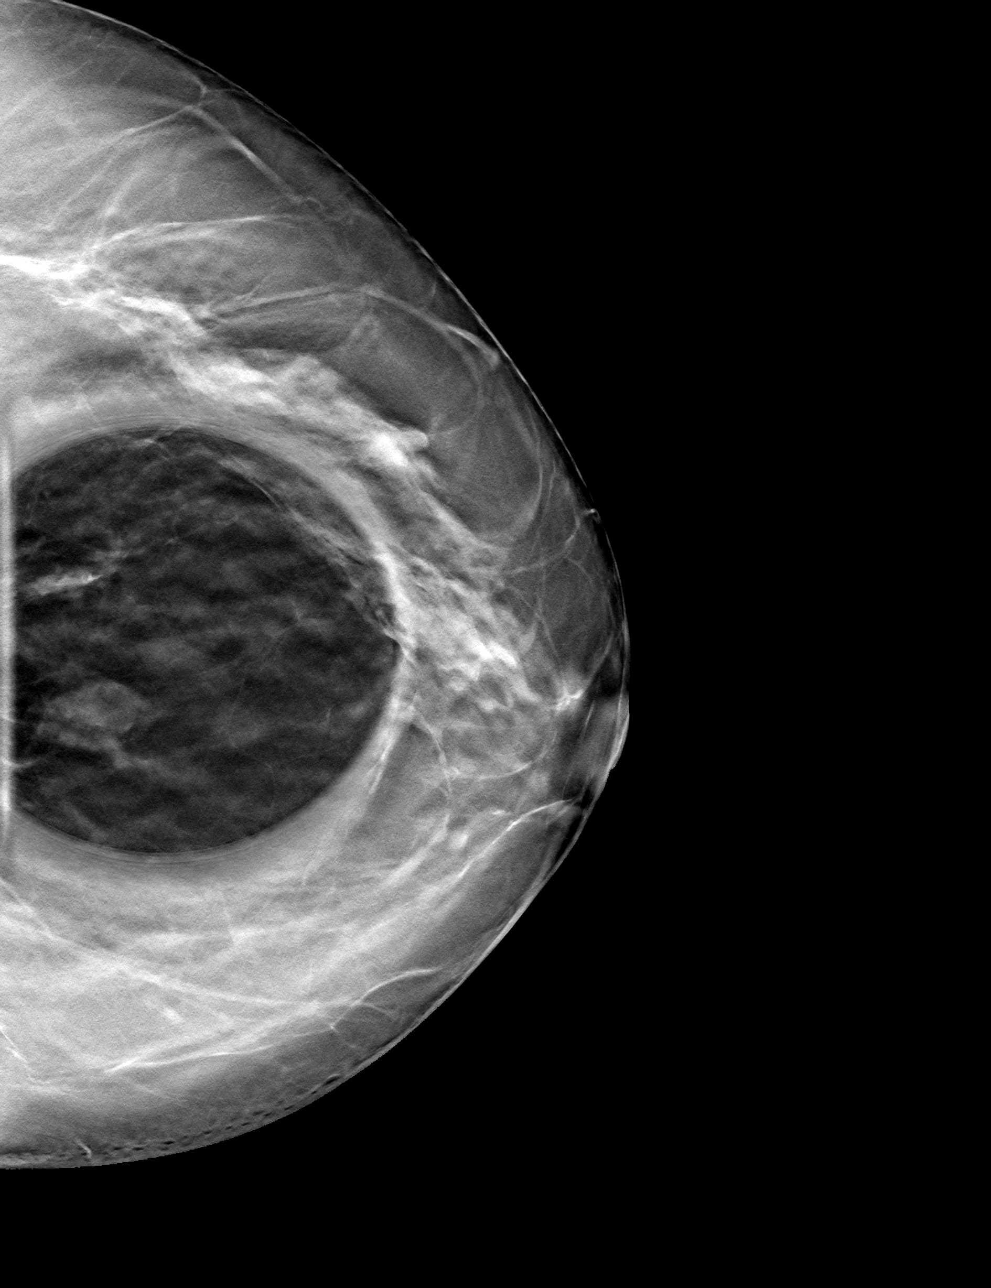

[4 of 12 positions shown; findings below may reference images not displayed]

ACR Breast Density Category b: There are scattered areas of
fibroglandular density.
FINDINGS: Additional tomograms were performed the left breast. There is a
persistent asymmetry in upper central posterior left breast in the
upper central left breast. Targeted ultrasound of the superior left
breast was performed demonstrating several areas of fibrocystic
change, with a likely area of fibrocystic change at 12 o'clock 5 cm
from nipple corresponding with the asymmetry seen in the left breast
at mammography. No suspicious masses or abnormality seen in the
superior left breast sonographically. No lymphadenopathy seen in the
left axilla.
IMPRESSION: Probably benign left breast asymmetry.

RECOMMENDATION:
Recommend six-month follow-up diagnostic mammography with possible
ultrasound of the left breast.

I have discussed the findings and recommendations with the patient.
If applicable, a reminder letter will be sent to the patient
regarding the next appointment.

BI-RADS CATEGORY  3: Probably benign.

## 2023-10-14 IMAGING — US US BREAST*L* LIMITED INC AXILLA
1 series · 11 of 11 positions shown · non-contrast
Comparison: Previous exam(s).

CLINICAL DATA: Screening recall for possible left breast mass.

EXAM:
DIGITAL DIAGNOSTIC UNILATERAL LEFT MAMMOGRAM WITH TOMOSYNTHESIS AND
CAD; ULTRASOUND LEFT BREAST LIMITED
TECHNIQUE: Left digital diagnostic mammography and breast tomosynthesis was
performed. The images were evaluated with computer-aided detection.;
Targeted ultrasound examination of the left breast was performed.

[Series 1: us breast*left* limited inc axilla · 0.07mm/px · 11 of 11 slices shown]
[im 1/11]
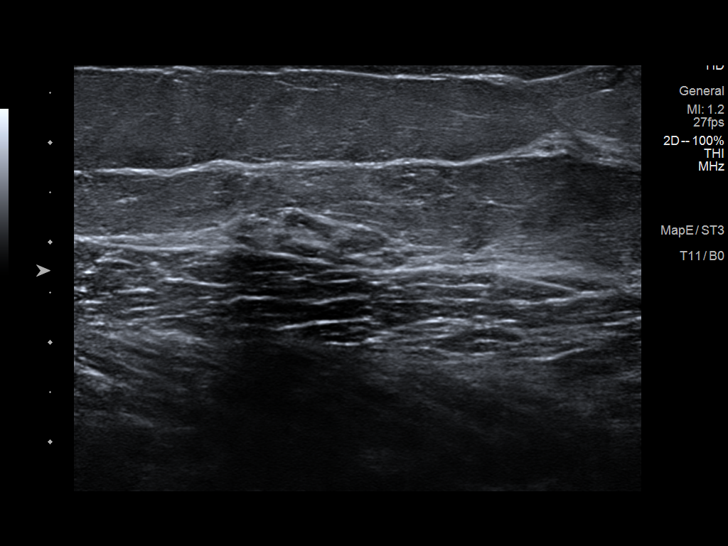
[im 2/11]
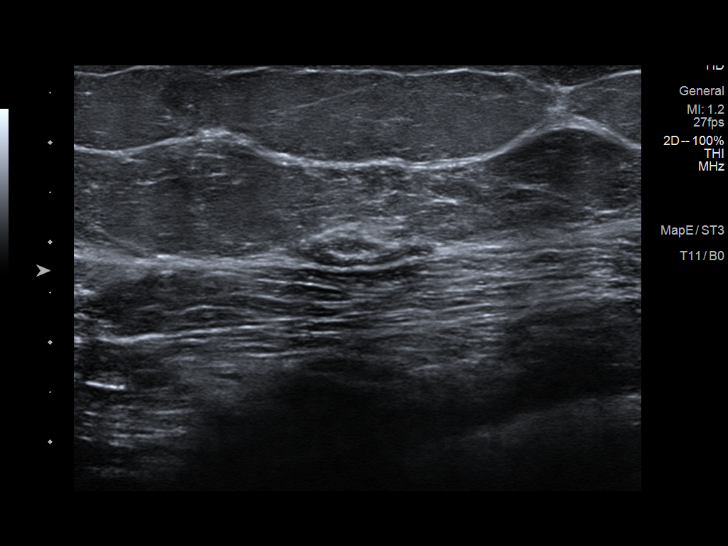
[im 3/11]
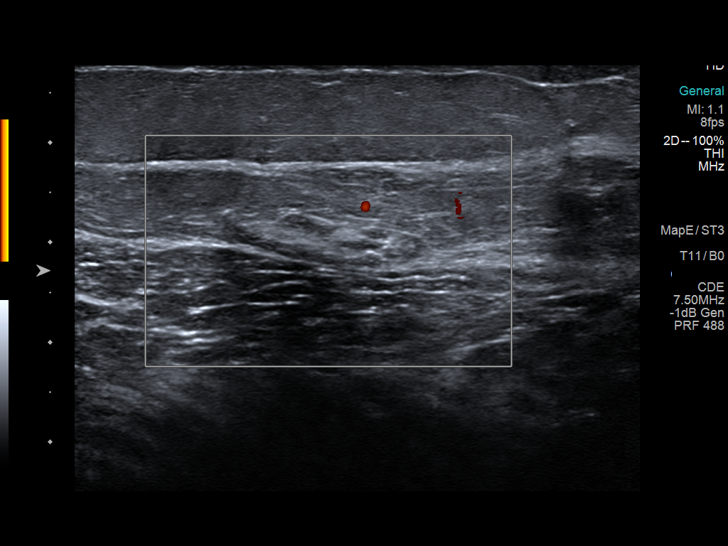
[im 4/11]
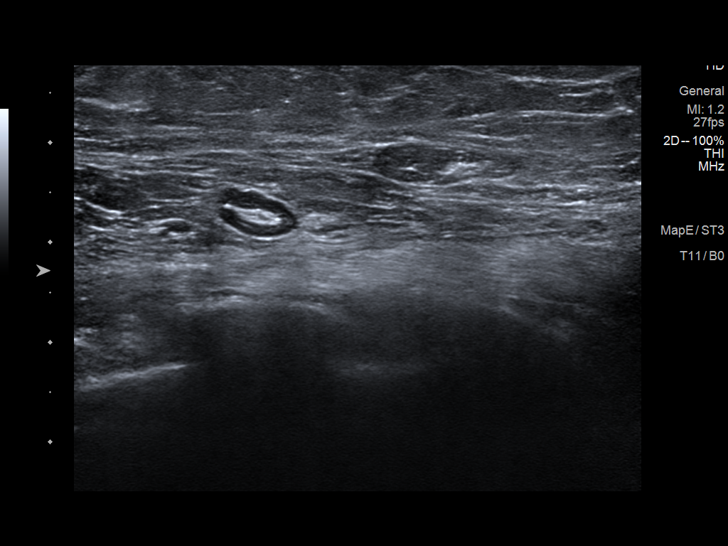
[im 5/11]
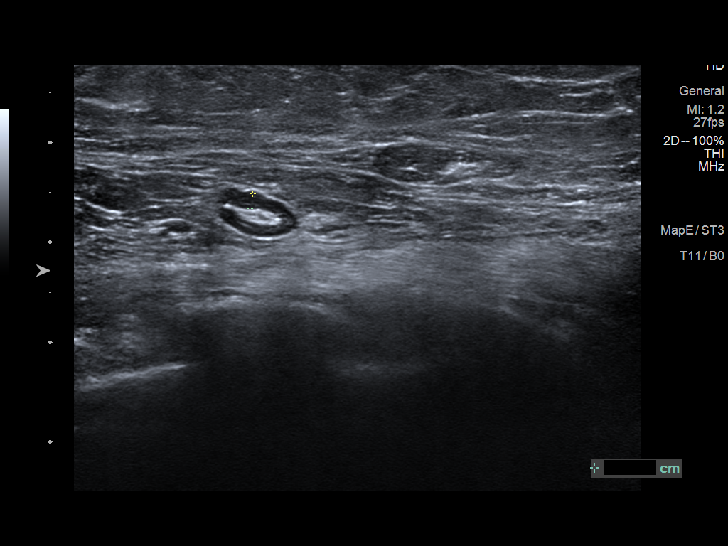
[im 6/11]
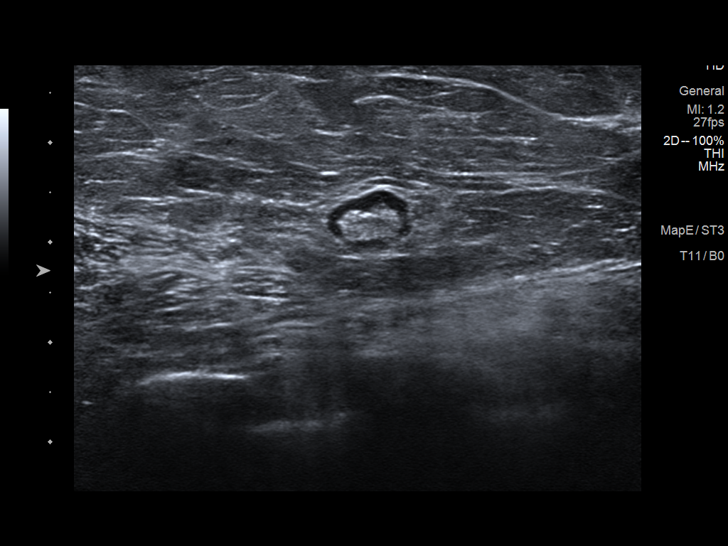
[im 7/11]
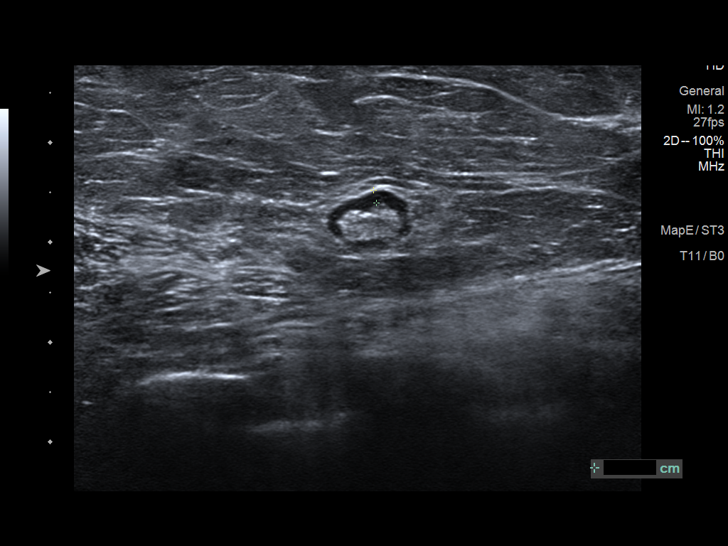
[im 8/11]
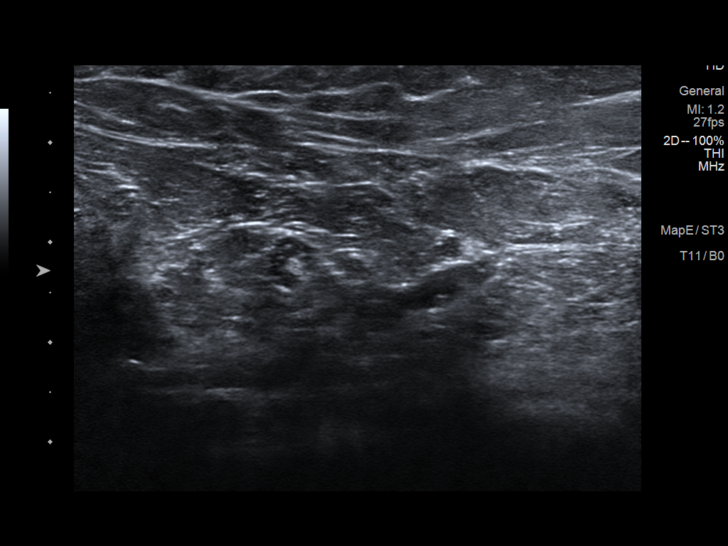
[im 9/11]
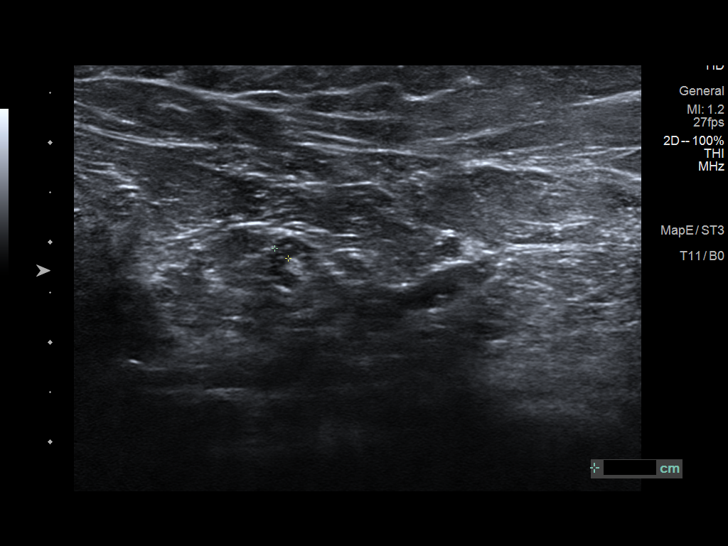
[im 10/11]
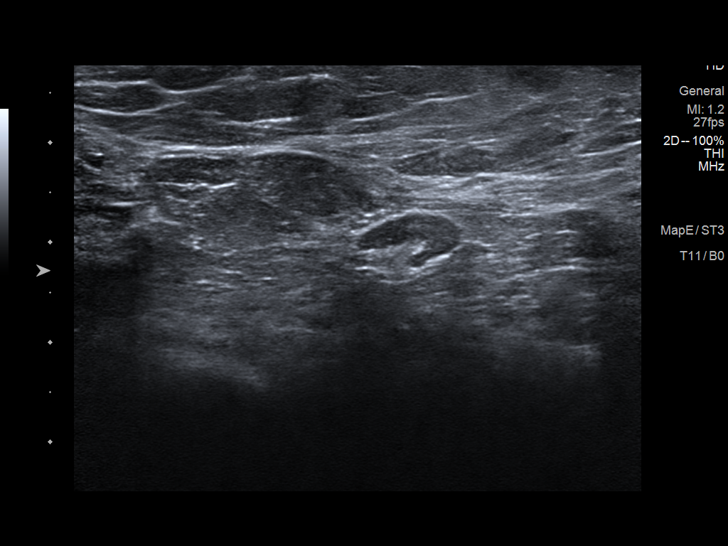
[im 11/11]
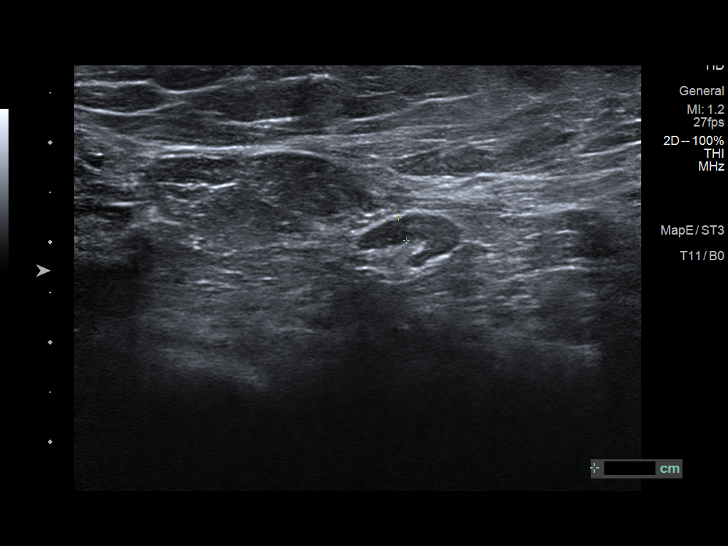

[11 of 11 positions shown; findings below may reference images not displayed]

ACR Breast Density Category b: There are scattered areas of
fibroglandular density.
FINDINGS: Additional tomograms were performed the left breast. There is a
persistent asymmetry in upper central posterior left breast in the
upper central left breast. Targeted ultrasound of the superior left
breast was performed demonstrating several areas of fibrocystic
change, with a likely area of fibrocystic change at 12 o'clock 5 cm
from nipple corresponding with the asymmetry seen in the left breast
at mammography. No suspicious masses or abnormality seen in the
superior left breast sonographically. No lymphadenopathy seen in the
left axilla.
IMPRESSION: Probably benign left breast asymmetry.

RECOMMENDATION:
Recommend six-month follow-up diagnostic mammography with possible
ultrasound of the left breast.

I have discussed the findings and recommendations with the patient.
If applicable, a reminder letter will be sent to the patient
regarding the next appointment.

BI-RADS CATEGORY  3: Probably benign.

## 2023-11-25 ENCOUNTER — Other Ambulatory Visit: Payer: Self-pay | Admitting: Obstetrics and Gynecology

## 2023-11-25 DIAGNOSIS — N6489 Other specified disorders of breast: Secondary | ICD-10-CM

## 2023-12-09 ENCOUNTER — Encounter

## 2023-12-13 ENCOUNTER — Encounter

## 2024-09-07 ENCOUNTER — Encounter
